# Patient Record
Sex: Male | Born: 1965
Health system: Southern US, Community
[De-identification: ages and names within clinical notes are randomized; demographics above are authoritative.]

## PROBLEM LIST (undated history)

## (undated) DIAGNOSIS — H9190 Unspecified hearing loss, unspecified ear: Secondary | ICD-10-CM

## (undated) DIAGNOSIS — F329 Major depressive disorder, single episode, unspecified: Secondary | ICD-10-CM

## (undated) DIAGNOSIS — F419 Anxiety disorder, unspecified: Secondary | ICD-10-CM

## (undated) DIAGNOSIS — T7840XA Allergy, unspecified, initial encounter: Secondary | ICD-10-CM

## (undated) DIAGNOSIS — K219 Gastro-esophageal reflux disease without esophagitis: Secondary | ICD-10-CM

## (undated) DIAGNOSIS — F32A Depression, unspecified: Secondary | ICD-10-CM

## (undated) DIAGNOSIS — I10 Essential (primary) hypertension: Secondary | ICD-10-CM

## (undated) DIAGNOSIS — E785 Hyperlipidemia, unspecified: Secondary | ICD-10-CM

## (undated) DIAGNOSIS — M1990 Unspecified osteoarthritis, unspecified site: Secondary | ICD-10-CM

## (undated) HISTORY — DX: Anxiety disorder, unspecified: F41.9

## (undated) HISTORY — PX: JOINT REPLACEMENT: SHX530

## (undated) HISTORY — PX: REPLACEMENT TOTAL KNEE BILATERAL: SUR1225

## (undated) HISTORY — DX: Gastro-esophageal reflux disease without esophagitis: K21.9

## (undated) HISTORY — DX: Unspecified osteoarthritis, unspecified site: M19.90

## (undated) HISTORY — DX: Hyperlipidemia, unspecified: E78.5

## (undated) HISTORY — DX: Major depressive disorder, single episode, unspecified: F32.9

## (undated) HISTORY — DX: Allergy, unspecified, initial encounter: T78.40XA

## (undated) HISTORY — PX: OTHER SURGICAL HISTORY: SHX169

## (undated) HISTORY — DX: Depression, unspecified: F32.A

## (undated) HISTORY — DX: Unspecified hearing loss, unspecified ear: H91.90

## (undated) HISTORY — DX: Essential (primary) hypertension: I10

---

## 2015-08-02 ENCOUNTER — Encounter: Payer: Self-pay | Admitting: Podiatry

## 2015-08-02 ENCOUNTER — Ambulatory Visit (INDEPENDENT_AMBULATORY_CARE_PROVIDER_SITE_OTHER): Payer: 59 | Admitting: Podiatry

## 2015-08-02 ENCOUNTER — Ambulatory Visit (INDEPENDENT_AMBULATORY_CARE_PROVIDER_SITE_OTHER): Payer: 59

## 2015-08-02 VITALS — BP 137/94 | HR 73 | Resp 18

## 2015-08-02 DIAGNOSIS — M722 Plantar fascial fibromatosis: Secondary | ICD-10-CM

## 2015-08-02 DIAGNOSIS — R52 Pain, unspecified: Secondary | ICD-10-CM | POA: Diagnosis not present

## 2015-08-02 DIAGNOSIS — M7730 Calcaneal spur, unspecified foot: Secondary | ICD-10-CM

## 2015-08-02 MED ORDER — MELOXICAM 15 MG PO TABS: 15.0000 mg | ORAL_TABLET | Freq: Every day | ORAL | Status: DC

## 2015-08-02 MED ORDER — METHYLPREDNISOLONE 4 MG PO TBPK: ORAL_TABLET | ORAL | Status: DC

## 2015-08-02 NOTE — Patient Instructions (Signed)
Start with medrol dose pack. Once this is complete you can start mobic. Do not take together  Plantar Fasciitis With Rehab The plantar fascia is a fibrous, ligament-like, soft-tissue structure that spans the bottom of the foot. Plantar fasciitis, also called heel spur syndrome, is a condition that causes pain in the foot due to inflammation of the tissue. SYMPTOMS   Pain and tenderness on the underneath side of the foot.  Pain that worsens with standing or walking. CAUSES  Plantar fasciitis is caused by irritation and injury to the plantar fascia on the underneath side of the foot. Common mechanisms of injury include:  Direct trauma to bottom of the foot.  Damage to a small nerve that runs under the foot where the main fascia attaches to the heel bone.  Stress placed on the plantar fascia due to bone spurs. RISK INCREASES WITH:   Activities that place stress on the plantar fascia (running, jumping, pivoting, or cutting).  Poor strength and flexibility.  Improperly fitted shoes.  Tight calf muscles.  Flat feet.  Failure to warm-up properly before activity.  Obesity. PREVENTION  Warm up and stretch properly before activity.  Allow for adequate recovery between workouts.  Maintain physical fitness:  Strength, flexibility, and endurance.  Cardiovascular fitness.  Maintain a health body weight.  Avoid stress on the plantar fascia.  Wear properly fitted shoes, including arch supports for individuals who have flat feet. PROGNOSIS  If treated properly, then the symptoms of plantar fasciitis usually resolve without surgery. However, occasionally surgery is necessary. RELATED COMPLICATIONS   Recurrent symptoms that may result in a chronic condition.  Problems of the lower back that are caused by compensating for the injury, such as limping.  Pain or weakness of the foot during push-off following surgery.  Chronic inflammation, scarring, and partial or complete fascia  tear, occurring more often from repeated injections. TREATMENT  Treatment initially involves the use of ice and medication to help reduce pain and inflammation. The use of strengthening and stretching exercises may help reduce pain with activity, especially stretches of the Achilles tendon. These exercises may be performed at home or with a therapist. Your caregiver may recommend that you use heel cups of arch supports to help reduce stress on the plantar fascia. Occasionally, corticosteroid injections are given to reduce inflammation. If symptoms persist for greater than 6 months despite non-surgical (conservative), then surgery may be recommended.  MEDICATION   If pain medication is necessary, then nonsteroidal anti-inflammatory medications, such as aspirin and ibuprofen, or other minor pain relievers, such as acetaminophen, are often recommended.  Do not take pain medication within 7 days before surgery.  Prescription pain relievers may be given if deemed necessary by your caregiver. Use only as directed and only as much as you need.  Corticosteroid injections may be given by your caregiver. These injections should be reserved for the most serious cases, because they may only be given a certain number of times. HEAT AND COLD  Cold treatment (icing) relieves pain and reduces inflammation. Cold treatment should be applied for 10 to 15 minutes every 2 to 3 hours for inflammation and pain and immediately after any activity that aggravates your symptoms. Use ice packs or massage the area with a piece of ice (ice massage).  Heat treatment may be used prior to performing the stretching and strengthening activities prescribed by your caregiver, physical therapist, or athletic trainer. Use a heat pack or soak the injury in warm water. SEEK IMMEDIATE MEDICAL CARE IF:  Treatment seems to offer no benefit, or the condition worsens.  Any medications produce adverse side effects. EXERCISES RANGE OF  MOTION (ROM) AND STRETCHING EXERCISES - Plantar Fasciitis (Heel Spur Syndrome) These exercises may help you when beginning to rehabilitate your injury. Your symptoms may resolve with or without further involvement from your physician, physical therapist or athletic trainer. While completing these exercises, remember:   Restoring tissue flexibility helps normal motion to return to the joints. This allows healthier, less painful movement and activity.  An effective stretch should be held for at least 30 seconds.  A stretch should never be painful. You should only feel a gentle lengthening or release in the stretched tissue. RANGE OF MOTION - Toe Extension, Flexion  Sit with your right / left leg crossed over your opposite knee.  Grasp your toes and gently pull them back toward the top of your foot. You should feel a stretch on the bottom of your toes and/or foot.  Hold this stretch for __________ seconds.  Now, gently pull your toes toward the bottom of your foot. You should feel a stretch on the top of your toes and or foot.  Hold this stretch for __________ seconds. Repeat __________ times. Complete this stretch __________ times per day.  RANGE OF MOTION - Ankle Dorsiflexion, Active Assisted  Remove shoes and sit on a chair that is preferably not on a carpeted surface.  Place right / left foot under knee. Extend your opposite leg for support.  Keeping your heel down, slide your right / left foot back toward the chair until you feel a stretch at your ankle or calf. If you do not feel a stretch, slide your bottom forward to the edge of the chair, while still keeping your heel down.  Hold this stretch for __________ seconds. Repeat __________ times. Complete this stretch __________ times per day.  STRETCH - Gastroc, Standing  Place hands on wall.  Extend right / left leg, keeping the front knee somewhat bent.  Slightly point your toes inward on your back foot.  Keeping your right /  left heel on the floor and your knee straight, shift your weight toward the wall, not allowing your back to arch.  You should feel a gentle stretch in the right / left calf. Hold this position for __________ seconds. Repeat __________ times. Complete this stretch __________ times per day. STRETCH - Soleus, Standing  Place hands on wall.  Extend right / left leg, keeping the other knee somewhat bent.  Slightly point your toes inward on your back foot.  Keep your right / left heel on the floor, bend your back knee, and slightly shift your weight over the back leg so that you feel a gentle stretch deep in your back calf.  Hold this position for __________ seconds. Repeat __________ times. Complete this stretch __________ times per day. STRETCH - Gastrocsoleus, Standing  Note: This exercise can place a lot of stress on your foot and ankle. Please complete this exercise only if specifically instructed by your caregiver.   Place the ball of your right / left foot on a step, keeping your other foot firmly on the same step.  Hold on to the wall or a rail for balance.  Slowly lift your other foot, allowing your body weight to press your heel down over the edge of the step.  You should feel a stretch in your right / left calf.  Hold this position for __________ seconds.  Repeat this exercise with  a slight bend in your right / left knee. Repeat __________ times. Complete this stretch __________ times per day.  STRENGTHENING EXERCISES - Plantar Fasciitis (Heel Spur Syndrome)  These exercises may help you when beginning to rehabilitate your injury. They may resolve your symptoms with or without further involvement from your physician, physical therapist or athletic trainer. While completing these exercises, remember:   Muscles can gain both the endurance and the strength needed for everyday activities through controlled exercises.  Complete these exercises as instructed by your physician,  physical therapist or athletic trainer. Progress the resistance and repetitions only as guided. STRENGTH - Towel Curls  Sit in a chair positioned on a non-carpeted surface.  Place your foot on a towel, keeping your heel on the floor.  Pull the towel toward your heel by only curling your toes. Keep your heel on the floor.  If instructed by your physician, physical therapist or athletic trainer, add ____________________ at the end of the towel. Repeat __________ times. Complete this exercise __________ times per day. STRENGTH - Ankle Inversion  Secure one end of a rubber exercise band/tubing to a fixed object (table, pole). Loop the other end around your foot just before your toes.  Place your fists between your knees. This will focus your strengthening at your ankle.  Slowly, pull your big toe up and in, making sure the band/tubing is positioned to resist the entire motion.  Hold this position for __________ seconds.  Have your muscles resist the band/tubing as it slowly pulls your foot back to the starting position. Repeat __________ times. Complete this exercises __________ times per day.    This information is not intended to replace advice given to you by your health care provider. Make sure you discuss any questions you have with your health care provider.   Document Released: 03/29/2005 Document Revised: 08/13/2014 Document Reviewed: 07/11/2008 Elsevier Interactive Patient Education [employer].

## 2015-08-02 NOTE — Progress Notes (Signed)
   Subjective:    Patient ID: Theodore Munoz, male    DOB: [birth year], 50 y.o.   MRN: [id number]  HPI  50 year old male presents the office with concerns of bilateral foot pain since about 2009. He states he sprained his feet when he does standing. When he walks the pain is relieved. He previoulsy has seen another physician several years ago he had inserts prescribed for which he wears power steps. He also states that a [employer] helps. He does stretch which also helps. Denies any numbness or tingling. No recent injury or trauma. No swelling or redness. The pain does not wake him up at night. No other complaints at this time.  Review of Systems  All other systems reviewed and are negative.      Objective:   Physical Exam General: AAO x3, NAD  Dermatological: Skin is warm, dry and supple bilateral. Nails x 10 are well manicured; remaining integument appears unremarkable at this time. There are no open sores, no preulcerative lesions, no rash or signs of infection present.  Vascular: Dorsalis Pedis artery and Posterior Tibial artery pedal pulses are 2/4 bilateral with immedate capillary fill time. Pedal hair growth present. No varicosities and no lower extremity edema present bilateral. There is no pain with calf compression, swelling, warmth, erythema.   Neruologic: Grossly intact via light touch bilateral. Vibratory intact via tuning fork bilateral. Protective threshold with Semmes Wienstein monofilament intact to all pedal sites bilateral. Patellar and Achilles deep tendon reflexes 2+ bilateral. No Babinski or clonus noted bilateral.   Musculoskeletal:Tenderness to palpation along the plantar medial tubercle of the calcaneus at the insertion of plantar fascia on the left and right foot. There is no pain along the course of the plantar fascia within the arch of the foot. Plantar fascia appears to be intact. There is no pain with lateral compression of the calcaneus or pain with vibratory  sensation. There is no pain along the course or insertion of the achilles tendon. No other areas of tenderness to bilateral lower extremities. Equinus is present. MMT 5/5, ROM WNL.   Gait: Unassisted, Nonantalgic.       Assessment & Plan:  50 year old male with bilateral heel pain, likely result of plantar fasciitis -Treatment options discussed including all alternatives, risks, and complications -Etiology of symptoms were discussed -X-rays were obtained and reviewed with the patient. Heel spur present. No evidence of acute fracture or stress fracture. -Patient elects to proceed with steroid injection into the left and right heel. Under sterile skin preparation, a total of 2.5cc of kenalog 10, 0.5% Marcaine plain, and 2% lidocaine plain were infiltrated into the symptomatic area without complication. A band-aid was applied. Patient tolerated the injection well without complication. Post-injection care with discussed with the patient. Discussed with the patient to ice the area over the next couple of days to help prevent a steroid flare.  -Plantar fascial braces were dispensed. -Discussed shoe gear changes. He will at the practicing new shoes. He is wearing older [employer] shoes today. -Ice daily -Stretching exercises and consistent basis. -He has night splint at home and recommended to use them as he has not been. -Follow-up in 3 weeks or sooner if any problems arise. In the meantime, encouraged to call the office with any questions, concerns, change in symptoms.   Celesta Gentile, DPM

## 2015-08-04 MED FILL — METHYLPREDNISOLONE 4 MG TAB: 4 | 6 days supply | Qty: 21 | Fill #0

## 2015-08-04 MED FILL — MELOXICAM 15 MG TABLET: 15 | 30 days supply | Qty: 30 | Fill #0

## 2015-08-22 ENCOUNTER — Ambulatory Visit (INDEPENDENT_AMBULATORY_CARE_PROVIDER_SITE_OTHER): Payer: 59 | Admitting: Podiatry

## 2015-08-22 ENCOUNTER — Encounter: Payer: Self-pay | Admitting: Podiatry

## 2015-08-22 DIAGNOSIS — M722 Plantar fascial fibromatosis: Secondary | ICD-10-CM

## 2015-08-22 DIAGNOSIS — M7730 Calcaneal spur, unspecified foot: Secondary | ICD-10-CM | POA: Diagnosis not present

## 2015-08-25 DIAGNOSIS — M722 Plantar fascial fibromatosis: Secondary | ICD-10-CM | POA: Insufficient documentation

## 2015-08-25 HISTORY — DX: Plantar fascial fibromatosis: M72.2

## 2015-08-25 NOTE — Progress Notes (Signed)
Patient ID: Theodore Munoz, male   DOB: 10/12/1965, 50 y.o.   MRN: [id number]  Subjective: 50 year old male presents the office they felt evaluation of bilateral heel pain. Since last appointment he states the injection has helped quite a bit and he has purchased new shoes. He has had significant resolution and heel pain since last appointment and I'll he has very mild discomfort at times currently. He is going on expansion any pain. His been anti-inflammatories as needed. Denies any systemic complaints such as fevers, chills, nausea, vomiting. No acute changes since last appointment, and no other complaints at this time.   Objective: AAO x3, NAD DP/PT pulses palpable bilaterally, CRT less than 3 seconds Protective sensation intact with Simms Weinstein monofilament Here is currently no tenderness to palpation along the plantar medial tubercle of the calcaneus at the insertion of plantar fascia on the left or right foot. There is no pain along the course of the plantar fascia within the arch of the foot. Plantar fascia appears to be intact. There is no pain with lateral compression of the calcaneus or pain with vibratory sensation. There is no pain along the course or insertion of the achilles tendon. No other areas of tenderness to bilateral lower extremities. No areas of pinpoint bony tenderness or pain with vibratory sensation. MMT 5/5, ROM WNL. No edema, erythema, increase in warmth to bilateral lower extremities.  No open lesions or pre-ulcerative lesions.  No pain with calf compression, swelling, warmth, erythema  Assessment: Resolving heel pain bilaterally  Plan: -All treatment options discussed with the patient including all alternatives, risks, complications.  -We'll hold off on second steroid injection this time as he has had significant resolution of symptoms. Recommend continue with stretching, icing as well as supportive shoes. Also anti-inflammatories as needed. Follow up with symptoms  not completely resolved within 4 weeks or sooner if he issues are to arise or any reoccurrence. -Patient encouraged to call the office with any questions, concerns, change in symptoms.   Celesta Gentile, DPM

## 2015-09-05 MED FILL — MELOXICAM 15 MG TABLET: 15 | 30 days supply | Qty: 30 | Fill #1

## 2015-09-22 ENCOUNTER — Encounter: Payer: Self-pay | Admitting: Podiatry

## 2015-09-22 ENCOUNTER — Ambulatory Visit (INDEPENDENT_AMBULATORY_CARE_PROVIDER_SITE_OTHER): Payer: 59 | Admitting: Podiatry

## 2015-09-22 DIAGNOSIS — M722 Plantar fascial fibromatosis: Secondary | ICD-10-CM

## 2015-09-22 DIAGNOSIS — M7730 Calcaneal spur, unspecified foot: Secondary | ICD-10-CM | POA: Diagnosis not present

## 2015-09-22 NOTE — Progress Notes (Signed)
Patient ID: Theodore Munoz, male   DOB: 06/13/1965, 50 y.o.   MRN: [id number]  Subjective: 50 year old male presents the office for follow-up evaluation of bilateral heel pain. He states that he continues to do well but he has started to notice some reoccurrence of pain although not at bad as it was before. He does stretch and ice and wear supportive shoes. No recent injury.  Denies any systemic complaints such as fevers, chills, nausea, vomiting. No acute changes since last appointment, and no other complaints at this time.   Objective: AAO x3, NAD DP/PT pulses palpable bilaterally, CRT less than 3 seconds Protective sensation intact with Simms Weinstein monofilament There is slight tenderness palpation of the plantar medial tubercle of the calcaneus at insertion of plantar fascial bilaterally. There is no pain within the arch of the foot. No pain lateral compression. Plantar fascia appears to be intact. No other areas of tenderness bilaterally. MMT 5/5. Equinus is present No open lesions or pre-ulcerative lesions.  No pain with calf compression, swelling, warmth, erythema  Assessment: Bilateral heel pain, plantar fasciitis  Plan: -All treatment options discussed with the patient including all alternatives, risks, complications.  -Discussed steroid injection but he would like to hold off as he is going to [place] and would like to see if the shots before he goes. -Dispensed night splint. -Stretching icing daily. -Continue supportive shoes and power steps. -Follow-up 6 weeks or sooner if needed.  Celesta Gentile, DPM

## 2015-11-03 ENCOUNTER — Ambulatory Visit (INDEPENDENT_AMBULATORY_CARE_PROVIDER_SITE_OTHER): Payer: 59 | Admitting: Podiatry

## 2015-11-03 DIAGNOSIS — M722 Plantar fascial fibromatosis: Secondary | ICD-10-CM

## 2015-11-03 MED ORDER — MELOXICAM 15 MG PO TABS: 15.0000 mg | ORAL_TABLET | Freq: Every day | ORAL | 2 refills | Status: DC

## 2015-11-03 MED FILL — MELOXICAM 15 MG TABLET: 15 | 30 days supply | Qty: 30 | Fill #2

## 2015-11-03 NOTE — Progress Notes (Signed)
Patient ID: Theodore Munoz, male   DOB: Jan 29, 1966, 50 y.o.   MRN: [id number]  Subjective: 50 year old male presents the office for follow-up evaluation of bilateral heel pain. He states that he is doing better. He still gets occasional pain to the heel. Course pain is when going barefoot or after walking for about 10-15 minutes. He has changes shoes and this has been helping quite a bit as well. Denies any numbness or tingling. The pain does not wake him up and IP is continuing stretching, icing.  Objective: AAO x3, NAD DP/PT pulses palpable bilaterally, CRT less than 3 seconds Protective sensation intact with Simms Weinstein monofilament There is mild tenderness palpation of the plantar medial tubercle of the calcaneus at insertion of plantar fascial bilaterally. There is no pain within the arch of the foot. No pain lateral compression. Plantar fascia appears to be intact. No other areas of tenderness bilaterally. MMT 5/5. Equinus is present No open lesions or pre-ulcerative lesions.  No pain with calf compression, swelling, warmth, erythema  Assessment: Bilateral heel pain, plantar fasciitis  Plan: -All treatment options discussed with the patient including all alternatives, risks, complications.  -Patient elects to proceed with steroid injection into the left and right heel. Under sterile skin preparation, a total of 2.5cc of kenalog 10, 0.5% Marcaine plain, and 2% lidocaine plain were infiltrated into the symptomatic area without complication. A band-aid was applied. Patient tolerated the injection well without complication. Post-injection care with discussed with the patient. Discussed with the patient to ice the area over the next couple of days to help prevent a steroid flare.  -Continue stretching, icing exercises daily. Continue supportive shoe gear. -Discussed orthtoics.  -Follow-up in 4 weeks or sooner if any problems arise. In the meantime, encouraged to call the office with any  questions, concerns, change in symptoms.    Celesta Gentile, DPM   -Discussed steroid injection but he would like to hold off as he is going to [place] and would like to see if the shots before he goes. -Dispensed night splint. -Stretching icing daily. -Continue supportive shoes and power steps. -Follow-up 6 weeks or sooner if needed.  Celesta Gentile, DPM

## 2015-12-08 ENCOUNTER — Encounter: Payer: Self-pay | Admitting: Podiatry

## 2015-12-08 ENCOUNTER — Ambulatory Visit (INDEPENDENT_AMBULATORY_CARE_PROVIDER_SITE_OTHER): Payer: 59 | Admitting: Podiatry

## 2015-12-08 DIAGNOSIS — M722 Plantar fascial fibromatosis: Secondary | ICD-10-CM | POA: Diagnosis not present

## 2015-12-08 MED ORDER — TRIAMCINOLONE ACETONIDE 10 MG/ML IJ SUSP: 10.0000 mg | Freq: Once | INTRAMUSCULAR | Status: DC

## 2015-12-08 NOTE — Progress Notes (Signed)
Patient ID: Theodore Munoz, male   DOB: Jun 01, 1965, 50 y.o.   MRN: [id number]  Subjective: 50 year old male presents the office for follow-up evaluation of bilateral heel pain. He states that he is doing better but he is still getting some pain mostly with standing. He does get relief from the steroid injections. He has been stretching icing intermittent leg. He digits returned from [place] doing a lot of walking started to have recurrence of the heel pain. No other complaints today.   Objective: AAO x3, NAD DP/PT pulses palpable bilaterally, CRT less than 3 seconds Protective sensation intact with Simms Weinstein monofilament There is mild continued  tenderness palpation of the plantar medial tubercle of the calcaneus at insertion of plantar fascial bilaterally. There is no pain within the arch of the foot. No pain lateral compression. Plantar fascia appears to be intact. No other areas of tenderness bilaterally. MMT 5/5. Equinus is present No open lesions or pre-ulcerative lesions.  No pain with calf compression, swelling, warmth, erythema  Assessment: Bilateral heel pain, plantar fasciitis  Plan: -All treatment options discussed with the patient including all alternatives, risks, complications.   -Patient elects to proceed with steroid injection into the left and right heel. Under sterile skin preparation, a total of 2.5cc of kenalog 10, 0.5% Marcaine plain, and 2% lidocaine plain were infiltrated into the symptomatic area without complication. A band-aid was applied. Patient tolerated the injection well without complication. Post-injection care with discussed with the patient. Discussed with the patient to ice the area over the next couple of days to help prevent a steroid flare.  -He was scanned for orthotics today and they were sent to [facility]. I modified his over-the-counter inserts for now. -Continue stretching, icing exercises daily. Continue supportive shoe gear. -Follow-up in 4 weeks  or sooner if any problems arise. In the meantime, encouraged to call the office with any questions, concerns, change in symptoms.    Celesta Gentile, DPM

## 2015-12-16 ENCOUNTER — Other Ambulatory Visit: Payer: Self-pay

## 2015-12-16 MED ORDER — MELOXICAM 15 MG PO TABS: 15.0000 mg | ORAL_TABLET | Freq: Every day | ORAL | 2 refills | Status: DC

## 2016-01-06 ENCOUNTER — Ambulatory Visit: Payer: 59 | Admitting: *Deleted

## 2016-01-06 DIAGNOSIS — M722 Plantar fascial fibromatosis: Secondary | ICD-10-CM

## 2016-01-06 NOTE — Progress Notes (Signed)
Patient ID: Theodore Munoz, male   DOB: January 21, 1966, 50 y.o.   MRN: [id number] Patient presents for orthotic pick up.  Verbal and written break in and wear instructions given.  Patient will follow up in 4 weeks if symptoms worsen or fail to improve.

## 2016-01-06 NOTE — Patient Instructions (Signed)

## 2016-01-21 ENCOUNTER — Telehealth: Payer: Self-pay | Admitting: *Deleted

## 2016-01-21 MED ORDER — MELOXICAM 15 MG PO TABS: 15.0000 mg | ORAL_TABLET | Freq: Every day | ORAL | 0 refills | Status: DC

## 2016-01-21 NOTE — Telephone Encounter (Signed)
Pt's wife, Tilda Burrow called states pt needs a refill of the Meloxicam, is having to take a lot of Ibuprofen. Left message informing Deanna I had refilled the Meloxicam for #15 to get pt to an appt he would need to schedule.

## 2016-03-22 MED FILL — MELOXICAM 15 MG TABLET: 15 | 30 days supply | Qty: 30 | Fill #0

## 2016-04-27 MED FILL — MELOXICAM 15 MG TABLET: 15 | 30 days supply | Qty: 30 | Fill #1

## 2016-04-30 DIAGNOSIS — M25561 Pain in right knee: Secondary | ICD-10-CM | POA: Diagnosis not present

## 2016-05-25 MED FILL — MELOXICAM 15 MG TABLET: 15 | 30 days supply | Qty: 30 | Fill #0

## 2016-06-30 MED FILL — MELOXICAM 15 MG TABLET: 15 | 30 days supply | Qty: 30 | Fill #1

## 2016-07-29 ENCOUNTER — Encounter: Payer: Self-pay | Admitting: Podiatry

## 2016-07-29 ENCOUNTER — Ambulatory Visit (INDEPENDENT_AMBULATORY_CARE_PROVIDER_SITE_OTHER): Payer: 59 | Admitting: Podiatry

## 2016-07-29 ENCOUNTER — Ambulatory Visit (INDEPENDENT_AMBULATORY_CARE_PROVIDER_SITE_OTHER): Payer: 59

## 2016-07-29 DIAGNOSIS — M722 Plantar fascial fibromatosis: Secondary | ICD-10-CM | POA: Diagnosis not present

## 2016-07-29 DIAGNOSIS — M7730 Calcaneal spur, unspecified foot: Secondary | ICD-10-CM

## 2016-07-29 MED ORDER — MELOXICAM 15 MG PO TABS: 15.0000 mg | ORAL_TABLET | Freq: Every day | ORAL | 1 refills | Status: DC

## 2016-07-29 MED FILL — MELOXICAM 15 MG TABLET: 15 | 30 days supply | Qty: 30 | Fill #0

## 2016-07-29 NOTE — Progress Notes (Signed)
Subjective: 51 year old male presents the office today for concerns of recurring bilateral heel pain. He states it feels the same as he has previously. He has pain in the morning or throbbing sensation laterally heel. This been worsening over the last 1 month. No recent injury or trauma. The pain does not wake debridement. Requesting injections as well as a refill the meloxicam. He is been stretching intermittently but not icing. Denies any systemic complaints such as fevers, chills, nausea, vomiting. No acute changes since last appointment, and no other complaints at this time.   Objective: AAO x3, NAD DP/PT pulses palpable bilaterally, CRT less than 3 seconds Tenderness to palpation along the plantar medial tubercle of the calcaneus at the insertion of plantar fascia on the left and right foot. There is no pain along the course of the plantar fascia within the arch of the foot. Plantar fascia appears to be intact. There is no pain with lateral compression of the calcaneus or pain with vibratory sensation. There is no pain along the course or insertion of the achilles tendon. No other areas of tenderness to bilateral lower extremities. Negative Tinel sign No open lesions or pre-ulcerative lesions.  No pain with calf compression, swelling, warmth, erythema  Assessment: Bilateral heel pain likely plantar fasciitis  Plan: -All treatment options discussed with the patient including all alternatives, risks, complications.  -X-rays were obtained and reviewed with the patient. Calcaneal spurring present. No evidence of acute fracture. -Patient elects to proceed with steroid injection into the left and right heel. Under sterile skin preparation, a total of 2.5cc of kenalog 10, 0.5% Marcaine plain, and 2% lidocaine plain were infiltrated into the symptomatic area without complication. A band-aid was applied. Patient tolerated the injection well without complication. Post-injection care with discussed with  the patient. Discussed with the patient to ice the area over the next couple of days to help prevent a steroid flare.  -Prescribed mobic. Discussed side effects of the medication and directed to stop if any are to occur and call the office.  -Return as stretching, icing exercises daily. -Continue orthotics as well as supportive shoe gear. -Patient encouraged to call the office with any questions, concerns, change in symptoms.   Theodore Munoz, DPM

## 2016-08-26 ENCOUNTER — Encounter: Payer: Self-pay | Admitting: Podiatry

## 2016-08-26 ENCOUNTER — Ambulatory Visit (INDEPENDENT_AMBULATORY_CARE_PROVIDER_SITE_OTHER): Payer: 59 | Admitting: Podiatry

## 2016-08-26 DIAGNOSIS — M722 Plantar fascial fibromatosis: Secondary | ICD-10-CM | POA: Diagnosis not present

## 2016-08-27 NOTE — Progress Notes (Signed)
Subjective: 50 year old male presents the office today for concerns of recurring bilateral heel pain. He states he was doing well but over the last week he is to develop recurrent heel pain. He states that he gets pain stiffness in the morning or if his been sitting for some time and stands back up. Denies any numbness or tingling pain does not waken at night. No recent injury. He has been on vacation and walking more. Denies any systemic complaints such as fevers, chills, nausea, vomiting. No acute changes since last appointment, and no other complaints at this time.   Objective: AAO x3, NAD DP/PT pulses palpable bilaterally, CRT less than 3 seconds There mis mild continued tenderness to palpation along the plantar medial tubercle of the calcaneus at the insertion of plantar fascia on the left and right foot. There is no pain along the course of the plantar fascia within the arch of the foot. Plantar fascia appears to be intact. There is no pain with lateral compression of the calcaneus or pain with vibratory sensation. There is no pain along the course or insertion of the achilles tendon. No other areas of tenderness to bilateral lower extremities. Negative Tinel sign No open lesions or pre-ulcerative lesions.  No pain with calf compression, swelling, warmth, erythema  Assessment: Bilateral heel pain likely plantar fasciitis  Plan: -All treatment options discussed with the patient including all alternatives, risks, complications.  -He has had multiple steroid injections and they seem to be temporary. Will hold off on any further. Recommend continue with inserts. This point recommend to start with physical therapy. Prescriptions provided today for this. Rx for [facility] provided.  -RTC after PT or sooner if needed.  -Patient encouraged to call the office with any questions, concerns, change in symptoms.   Celesta Gentile, DPM

## 2016-09-13 MED FILL — MELOXICAM 15 MG TABLET: 15 | 30 days supply | Qty: 30 | Fill #1

## 2016-09-23 DIAGNOSIS — M25571 Pain in right ankle and joints of right foot: Secondary | ICD-10-CM | POA: Diagnosis not present

## 2016-09-23 DIAGNOSIS — M25572 Pain in left ankle and joints of left foot: Secondary | ICD-10-CM | POA: Diagnosis not present

## 2016-09-23 DIAGNOSIS — M25674 Stiffness of right foot, not elsewhere classified: Secondary | ICD-10-CM | POA: Diagnosis not present

## 2016-09-28 DIAGNOSIS — M25572 Pain in left ankle and joints of left foot: Secondary | ICD-10-CM | POA: Diagnosis not present

## 2016-09-28 DIAGNOSIS — M25674 Stiffness of right foot, not elsewhere classified: Secondary | ICD-10-CM | POA: Diagnosis not present

## 2016-09-28 DIAGNOSIS — M25571 Pain in right ankle and joints of right foot: Secondary | ICD-10-CM | POA: Diagnosis not present

## 2016-09-30 DIAGNOSIS — M25572 Pain in left ankle and joints of left foot: Secondary | ICD-10-CM | POA: Diagnosis not present

## 2016-09-30 DIAGNOSIS — M25571 Pain in right ankle and joints of right foot: Secondary | ICD-10-CM | POA: Diagnosis not present

## 2016-09-30 DIAGNOSIS — M25674 Stiffness of right foot, not elsewhere classified: Secondary | ICD-10-CM | POA: Diagnosis not present

## 2016-10-05 DIAGNOSIS — M25674 Stiffness of right foot, not elsewhere classified: Secondary | ICD-10-CM | POA: Diagnosis not present

## 2016-10-05 DIAGNOSIS — M25572 Pain in left ankle and joints of left foot: Secondary | ICD-10-CM | POA: Diagnosis not present

## 2016-10-05 DIAGNOSIS — M25571 Pain in right ankle and joints of right foot: Secondary | ICD-10-CM | POA: Diagnosis not present

## 2016-10-07 DIAGNOSIS — M25572 Pain in left ankle and joints of left foot: Secondary | ICD-10-CM | POA: Diagnosis not present

## 2016-10-07 DIAGNOSIS — M25571 Pain in right ankle and joints of right foot: Secondary | ICD-10-CM | POA: Diagnosis not present

## 2016-10-07 DIAGNOSIS — M25674 Stiffness of right foot, not elsewhere classified: Secondary | ICD-10-CM | POA: Diagnosis not present

## 2016-10-12 DIAGNOSIS — M25572 Pain in left ankle and joints of left foot: Secondary | ICD-10-CM | POA: Diagnosis not present

## 2016-10-12 DIAGNOSIS — M25674 Stiffness of right foot, not elsewhere classified: Secondary | ICD-10-CM | POA: Diagnosis not present

## 2016-10-12 DIAGNOSIS — M25571 Pain in right ankle and joints of right foot: Secondary | ICD-10-CM | POA: Diagnosis not present

## 2016-10-14 DIAGNOSIS — M25572 Pain in left ankle and joints of left foot: Secondary | ICD-10-CM | POA: Diagnosis not present

## 2016-10-14 DIAGNOSIS — M25674 Stiffness of right foot, not elsewhere classified: Secondary | ICD-10-CM | POA: Diagnosis not present

## 2016-10-14 DIAGNOSIS — M25571 Pain in right ankle and joints of right foot: Secondary | ICD-10-CM | POA: Diagnosis not present

## 2016-10-19 ENCOUNTER — Telehealth: Payer: Self-pay | Admitting: *Deleted

## 2016-10-19 DIAGNOSIS — M25674 Stiffness of right foot, not elsewhere classified: Secondary | ICD-10-CM | POA: Diagnosis not present

## 2016-10-19 DIAGNOSIS — M25571 Pain in right ankle and joints of right foot: Secondary | ICD-10-CM | POA: Diagnosis not present

## 2016-10-19 DIAGNOSIS — M25572 Pain in left ankle and joints of left foot: Secondary | ICD-10-CM | POA: Diagnosis not present

## 2016-10-19 MED ORDER — MELOXICAM 15 MG PO TABS: 15.0000 mg | ORAL_TABLET | Freq: Every day | ORAL | 0 refills | Status: DC

## 2016-10-19 MED FILL — MELOXICAM 15 MG TABLET: 15 | 30 days supply | Qty: 30 | Fill #0

## 2016-10-19 NOTE — Telephone Encounter (Signed)
Pt presented with [facility] form to be signed by Dr. Jacqualyn Posey and requested refill of the Meloxicam. I signed for Dr. Jacqualyn Posey, and standing order okayed the Meloxicam for #30 one tablet daily.

## 2016-10-21 DIAGNOSIS — M25572 Pain in left ankle and joints of left foot: Secondary | ICD-10-CM | POA: Diagnosis not present

## 2016-10-21 DIAGNOSIS — M25571 Pain in right ankle and joints of right foot: Secondary | ICD-10-CM | POA: Diagnosis not present

## 2016-10-21 DIAGNOSIS — M25674 Stiffness of right foot, not elsewhere classified: Secondary | ICD-10-CM | POA: Diagnosis not present

## 2016-10-26 DIAGNOSIS — M25572 Pain in left ankle and joints of left foot: Secondary | ICD-10-CM | POA: Diagnosis not present

## 2016-10-26 DIAGNOSIS — M25674 Stiffness of right foot, not elsewhere classified: Secondary | ICD-10-CM | POA: Diagnosis not present

## 2016-10-26 DIAGNOSIS — M25571 Pain in right ankle and joints of right foot: Secondary | ICD-10-CM | POA: Diagnosis not present

## 2016-11-09 DIAGNOSIS — M25674 Stiffness of right foot, not elsewhere classified: Secondary | ICD-10-CM | POA: Diagnosis not present

## 2016-11-09 DIAGNOSIS — M25572 Pain in left ankle and joints of left foot: Secondary | ICD-10-CM | POA: Diagnosis not present

## 2016-11-09 DIAGNOSIS — M25571 Pain in right ankle and joints of right foot: Secondary | ICD-10-CM | POA: Diagnosis not present

## 2016-11-16 DIAGNOSIS — M25572 Pain in left ankle and joints of left foot: Secondary | ICD-10-CM | POA: Diagnosis not present

## 2016-11-16 DIAGNOSIS — M25674 Stiffness of right foot, not elsewhere classified: Secondary | ICD-10-CM | POA: Diagnosis not present

## 2016-11-16 DIAGNOSIS — M25571 Pain in right ankle and joints of right foot: Secondary | ICD-10-CM | POA: Diagnosis not present

## 2016-11-18 DIAGNOSIS — M25572 Pain in left ankle and joints of left foot: Secondary | ICD-10-CM | POA: Diagnosis not present

## 2016-11-18 DIAGNOSIS — M25674 Stiffness of right foot, not elsewhere classified: Secondary | ICD-10-CM | POA: Diagnosis not present

## 2016-11-18 DIAGNOSIS — M25571 Pain in right ankle and joints of right foot: Secondary | ICD-10-CM | POA: Diagnosis not present

## 2017-06-30 ENCOUNTER — Ambulatory Visit: Payer: 59 | Admitting: Family Medicine

## 2017-06-30 ENCOUNTER — Encounter: Payer: Self-pay | Admitting: Family Medicine

## 2017-06-30 VITALS — BP 132/84 | HR 63 | Temp 98.1°F | Ht 69.0 in | Wt 241.5 lb

## 2017-06-30 DIAGNOSIS — Z23 Encounter for immunization: Secondary | ICD-10-CM

## 2017-06-30 DIAGNOSIS — Z114 Encounter for screening for human immunodeficiency virus [HIV]: Secondary | ICD-10-CM

## 2017-06-30 DIAGNOSIS — Z0000 Encounter for general adult medical examination without abnormal findings: Secondary | ICD-10-CM | POA: Diagnosis not present

## 2017-06-30 DIAGNOSIS — Z1211 Encounter for screening for malignant neoplasm of colon: Secondary | ICD-10-CM

## 2017-06-30 NOTE — Patient Instructions (Addendum)
Call your pharmacy to see about the availability of the new shingles vaccine (Shingrix).  Aim to do some physical exertion for 150 minutes per week. This is typically divided into 5 days per week, 30 minutes per day. The activity should be enough to get your heart rate up. Anything is better than nothing if you have time constraints.  If you do not hear anything about your referral in the next 1-2 weeks, call our office and ask for an update.  If your Mrs has any questions regarding our visit, have her send [place] a MyChart message.   Let us know if you need anything.

## 2017-06-30 NOTE — Progress Notes (Signed)
Chief Complaint  Patient presents with  . Establish Care    Well Male Theodore Munoz is here for a complete physical.   His last physical was >1 year ago.  Current diet: in general, a "healthy" diet, could be better   Current exercise: none Weight trend: stable Does pt snore? No. Daytime fatigue? No. Seat belt? Yes.    Health maintenance Colonoscopy- No Tetanus- No HIV- No Prostate cancer screening- No   Past Medical History:  Diagnosis Date  . Allergy   . Arthritis   . Depression   . GERD (gastroesophageal reflux disease)   . Hearing loss     Past Surgical History:  Procedure Laterality Date  . JOINT REPLACEMENT     Medications  Current Outpatient Medications on File Prior to Visit  Medication Sig Dispense Refill  . famotidine (PEPCID) 20 MG tablet Take 20 mg by mouth 2 (two) times daily.    . naproxen sodium (ALEVE) 220 MG tablet Take 220 mg by mouth 2 (two) times daily as needed.     Allergies No Known Allergies Family History Family History  Problem Relation Age of Onset  . Alcohol abuse Mother   . Arthritis Mother   . Thyroid cancer Mother   . Depression Mother   . Anxiety disorder Mother     Review of Systems: Constitutional:  no fevers or chills Eye:  no recent significant change in vision Ear/Nose/Mouth/Throat:  Ears:  no hearing loss Nose/Mouth/Throat:  no complaints of nasal congestion or bleeding, no sore throat Cardiovascular:  no chest pain, no palpitations Respiratory:  no cough and no shortness of breath Gastrointestinal:  no abdominal pain, no change in bowel habits, no nausea, vomiting, diarrhea, or constipation and no black or bloody stool GU:  Male: negative for dysuria, frequency, and incontinence and negative for prostate symptoms Musculoskeletal/Extremities: +various joint pains in AM; no current pain, redness, or swelling of the joints Integumentary (Skin/Breast):  no abnormal skin lesions reported Neurologic:  no  headaches Endocrine: No unexpected weight changes Hematologic/Lymphatic:  no abnormal bleeding  Exam BP 132/84 (BP Location: Left Arm, Patient Position: Sitting, Cuff Size: Large)   Pulse 63   Temp 98.1 F (36.7 C) (Oral)   Ht 5\' 9"  (1.753 m)   Wt 241 lb 8 oz (109.5 kg)   SpO2 94%   BMI 35.66 kg/m  General:  well developed, well nourished, in no apparent distress Skin:  no significant moles, warts, or growths Head:  no masses, lesions, or tenderness Eyes:  pupils equal and round, sclera anicteric without injection Ears:  canals without lesions, TMs shiny without retraction, no obvious effusion, no erythema Nose:  nares patent, septum midline, mucosa normal Throat/Pharynx:  lips and gingiva without lesion; tongue and uvula midline; non-inflamed pharynx; no exudates or postnasal drainage Neck: neck supple without adenopathy, thyromegaly, or masses Lungs:  clear to auscultation, breath sounds equal bilaterally, no respiratory distress Cardio:  regular rate and rhythm without murmurs, heart sounds without clicks or rubs Abdomen:  abdomen soft, nontender; bowel sounds normal; no masses or organomegaly Genital (male): circumcised penis, no lesions or discharge; testes present bilaterally without masses or tenderness Rectal: Deferred Musculoskeletal:  symmetrical muscle groups noted without atrophy or deformity Extremities:  no clubbing, cyanosis, or edema, no deformities, no skin discoloration Neuro:  gait normal; deep tendon reflexes normal and symmetric Psych: well oriented with normal range of affect and appropriate judgment/insight  Assessment and Plan  Well adult exam - Plan: Comprehensive metabolic panel,  Lipid panel  Screening for HIV (human immunodeficiency virus) - Plan: HIV antibody  Screen for colon cancer - Plan: Ambulatory referral to Gastroenterology  Need for tetanus booster - Plan: Tdap vaccine greater than or equal to 73yo IM   Well 52 y.o. male. Counseled on diet  and exercise. Counseled on risks and benefits of prostate cancer screening with PSA. The patient agrees to forego testing. Immunizations, labs, and further orders as above. Follow up in 1 yr or prn. The patient voiced understanding and agreement to the plan.  [place], DO 06/30/17 12:23 PM

## 2017-06-30 NOTE — Progress Notes (Signed)
Pre visit review using our clinic review tool, if applicable. No additional management support is needed unless otherwise documented below in the visit note. 

## 2017-07-01 LAB — COMPREHENSIVE METABOLIC PANEL
ALBUMIN: 4.4 g/dL (ref 3.5–5.2)
ALK PHOS: 60 U/L (ref 39–117)
ALT: 34 U/L (ref 0–53)
AST: 22 U/L (ref 0–37)
BUN: 17 mg/dL (ref 6–23)
CHLORIDE: 107 meq/L (ref 96–112)
CO2: 26 meq/L (ref 19–32)
CREATININE: 1.01 mg/dL (ref 0.40–1.50)
Calcium: 9.2 mg/dL (ref 8.4–10.5)
GFR: 82.86 mL/min (ref >60.00)
GLUCOSE: 102 mg/dL — AB (ref 70–99)
POTASSIUM: 4 meq/L (ref 3.5–5.1)
SODIUM: 142 meq/L (ref 135–145)
Total Bilirubin: 0.7 mg/dL (ref 0.2–1.2)
Total Protein: 6.8 g/dL (ref 6.0–8.3)

## 2017-07-01 LAB — LIPID PANEL
Cholesterol: 162 mg/dL (ref 0–200)
HDL: 43.6 mg/dL (ref >39.00)
LDL Cholesterol: 86 mg/dL (ref 0–99)
NonHDL: 118.71
Total CHOL/HDL Ratio: 4
Triglycerides: 163 mg/dL — ABNORMAL HIGH (ref 0.0–149.0)
VLDL: 32.6 mg/dL (ref 0.0–40.0)

## 2017-07-01 LAB — HIV ANTIBODY (ROUTINE TESTING W REFLEX): HIV 1&2 Ab, 4th Generation: NONREACTIVE

## 2017-07-08 ENCOUNTER — Encounter: Payer: Self-pay | Admitting: Internal Medicine

## 2017-10-03 ENCOUNTER — Encounter: Payer: 59 | Admitting: Internal Medicine

## 2017-10-20 ENCOUNTER — Encounter: Payer: Self-pay | Admitting: Family Medicine

## 2017-12-01 ENCOUNTER — Ambulatory Visit (AMBULATORY_SURGERY_CENTER): Payer: 59 | Admitting: *Deleted

## 2017-12-01 ENCOUNTER — Other Ambulatory Visit: Payer: Self-pay

## 2017-12-01 ENCOUNTER — Encounter: Payer: Self-pay | Admitting: Gastroenterology

## 2017-12-01 ENCOUNTER — Telehealth: Payer: Self-pay | Admitting: Gastroenterology

## 2017-12-01 VITALS — Ht 68.0 in | Wt 244.4 lb

## 2017-12-01 DIAGNOSIS — Z1211 Encounter for screening for malignant neoplasm of colon: Secondary | ICD-10-CM

## 2017-12-01 MED ORDER — SUPREP BOWEL PREP KIT 17.5-3.13-1.6 GM/177ML PO SOLN: 1.0000 | Freq: Once | ORAL | 0 refills | Status: AC

## 2017-12-01 MED FILL — SUPREP BOWEL PREP KIT: 17.5-3.13-1 | 1 days supply | Qty: 354 | Fill #0

## 2017-12-01 NOTE — Progress Notes (Signed)
Patient denies any allergies to egg or soy products. Patient denies complications with anesthesia/sedation.  Patient denies oxygen use at home and denies diet medications. Pamphlet given to patient on Colonoscopy.

## 2017-12-01 NOTE — Telephone Encounter (Signed)
Needs to be 1 whole Suprep kit which is 2 bottles- Pharmacy notified

## 2017-12-01 NOTE — Telephone Encounter (Signed)
[facility] outpatient pharmacy calling to get clarification on prep for patient. Pt had pv today 8.22.19 and prep instructions to the pharmacy state 123mL which is only one bottle. Pharmacy requesting a call back from a nurse.

## 2017-12-15 ENCOUNTER — Ambulatory Visit (AMBULATORY_SURGERY_CENTER): Payer: 59 | Admitting: Gastroenterology

## 2017-12-15 ENCOUNTER — Encounter: Payer: Self-pay | Admitting: Gastroenterology

## 2017-12-15 VITALS — BP 137/79 | HR 61 | Temp 97.8°F | Resp 12 | Ht 68.0 in | Wt 244.0 lb

## 2017-12-15 DIAGNOSIS — Z1211 Encounter for screening for malignant neoplasm of colon: Secondary | ICD-10-CM

## 2017-12-15 DIAGNOSIS — K6389 Other specified diseases of intestine: Secondary | ICD-10-CM | POA: Diagnosis not present

## 2017-12-15 DIAGNOSIS — D125 Benign neoplasm of sigmoid colon: Secondary | ICD-10-CM

## 2017-12-15 DIAGNOSIS — D127 Benign neoplasm of rectosigmoid junction: Secondary | ICD-10-CM | POA: Diagnosis not present

## 2017-12-15 DIAGNOSIS — K635 Polyp of colon: Secondary | ICD-10-CM

## 2017-12-15 MED ORDER — SODIUM CHLORIDE 0.9 % IV SOLN: 500.0000 mL | Freq: Once | INTRAVENOUS | Status: DC

## 2017-12-15 NOTE — Progress Notes (Signed)
Pt's states no medical or surgical changes since previsit or office visit. 

## 2017-12-15 NOTE — Patient Instructions (Signed)
YOU HAD AN ENDOSCOPIC PROCEDURE TODAY AT [facility]:   Refer to the procedure report that was given to you for any specific questions about what was found during the examination.  If the procedure report does not answer your questions, please call your gastroenterologist to clarify.  If you requested that your care partner not be given the details of your procedure findings, then the procedure report has been included in a sealed envelope for you to review at your convenience later.  Thank you for allowing [place] to care for you today!  Resume previous diet and medications.  Await pathology results by mail, 7-10 days.  High Fiber diet is reasonsable due to presence of hemorrhoids and to decrease issues in future.  Repeat colonoscopy 5 years unless guidelines change.  Handouts given for polyps and High Fiber diets.    YOU SHOULD EXPECT: Some feelings of bloating in the abdomen. Passage of more gas than usual.  Walking can help get rid of the air that was put into your GI tract during the procedure and reduce the bloating. If you had a lower endoscopy (such as a colonoscopy or flexible sigmoidoscopy) you may notice spotting of blood in your stool or on the toilet paper. If you underwent a bowel prep for your procedure, you may not have a normal bowel movement for a few days.  Please Note:  You might notice some irritation and congestion in your nose or some drainage.  This is from the oxygen used during your procedure.  There is no need for concern and it should clear up in a day or so.  SYMPTOMS TO REPORT IMMEDIATELY:   Following lower endoscopy (colonoscopy or flexible sigmoidoscopy):  Excessive amounts of blood in the stool  Significant tenderness or worsening of abdominal pains  Swelling of the abdomen that is new, acute  Fever of 100F or higher   For urgent or emergent issues, a gastroenterologist can be reached at any hour by calling [phone].   DIET:  We do  recommend a small meal at first, but then you may proceed to your regular diet.  Drink plenty of fluids but you should avoid alcoholic beverages for 24 hours.  ACTIVITY:  You should plan to take it easy for the rest of today and you should NOT DRIVE or use heavy machinery until tomorrow (because of the sedation medicines used during the test).    FOLLOW UP: Our staff will call the number listed on your records the next business day following your procedure to check on you and address any questions or concerns that you may have regarding the information given to you following your procedure. If we do not reach you, we will leave a message.  However, if you are feeling well and you are not experiencing any problems, there is no need to return our call.  We will assume that you have returned to your regular daily activities without incident.  If any biopsies were taken you will be contacted by phone or by letter within the next 1-3 weeks.  Please call us at [phone] if you have not heard about the biopsies in 3 weeks.    SIGNATURES/CONFIDENTIALITY: You and/or your care partner have signed paperwork which will be entered into your electronic medical record.  These signatures attest to the fact that that the information above on your After Visit Summary has been reviewed and is understood.  Full responsibility of the confidentiality of this discharge information lies  with you and/or your care-partner.

## 2017-12-15 NOTE — Op Note (Addendum)
[facility] Patient Name: Theodore Munoz Procedure Date: 12/15/2017 7:40 AM MRN: [id number] Endoscopist: Justice Britain , MD Age: 52 Referring MD:  Date of Birth: 1966-02-18 Gender: Male Account #: [id number] Procedure:                Colonoscopy Indications:              Screening for malignant neoplasm in the colon, This                            is the patient's first colonoscopy Medicines:                Monitored Anesthesia Care Procedure:                Pre-Anesthesia Assessment:                           - Prior to the procedure, a History and Physical                            was performed, and patient medications and                            allergies were reviewed. The patient's tolerance of                            previous anesthesia was also reviewed. The risks                            and benefits of the procedure and the sedation                            options and risks were discussed with the patient.                            All questions were answered, and informed consent                            was obtained. Prior Anticoagulants: The patient has                            taken no previous anticoagulant or antiplatelet                            agents. ASA Grade Assessment: II - A patient with                            mild systemic disease. After reviewing the risks                            and benefits, the patient was deemed in                            satisfactory condition to undergo the procedure.  After obtaining informed consent, the colonoscope                            was passed under direct vision. Throughout the                            procedure, the patient's blood pressure, pulse, and                            oxygen saturations were monitored continuously. The                            Colonoscope was introduced through the anus and                            advanced to the the  cecum, identified by                            transillumination. The colonoscopy was performed                            without difficulty. The patient tolerated the                            procedure well. The quality of the bowel                            preparation was evaluated using the BBPS [facility]                            Bowel Preparation Scale) with scores of: Right                            Colon = 3, Transverse Colon = 3 and Left Colon = 3                            (entire mucosa seen well with no residual staining,                            small fragments of stool or opaque liquid). The                            total BBPS score equals 9. Scope In: 7:56:55 AM Scope Out: 8:12:12 AM Scope Withdrawal Time: 0 hours 10 minutes 41 seconds  Total Procedure Duration: 0 hours 15 minutes 17 seconds  Findings:                 The digital rectal exam findings include                            non-thrombosed external hemorrhoids. Pertinent                            negatives include no palpable rectal lesions.  The terminal ileum and ileocecal valve appeared                            normal.                           A 3 mm polyp was found in the sigmoid colon. The                            polyp was sessile. The polyp was removed with a                            jumbo cold forceps. Resection and retrieval were                            complete.                           A 4 mm polyp was found in the recto-sigmoid colon.                            The polyp was semi-sessile. The polyp was removed                            with a cold snare. Resection and retrieval were                            complete.                           Normal mucosa was found in the entire colon                            otherwise.                           Non-bleeding non-thrombosed external and internal                            hemorrhoids were found  during retroflexion, during                            perianal exam and during digital exam. The                            hemorrhoids were Grade I (internal hemorrhoids that                            do not prolapse). Complications:            No immediate complications. Estimated Blood Loss:     Estimated blood loss was minimal. Impression:               - Non-thrombosed external hemorrhoids found on  digital rectal exam.                           - The examined portion of the ileum was normal.                           - One 3 mm polyp in the sigmoid colon, removed with                            a jumbo cold forceps. Resected and retrieved.                           - One 4 mm polyp at the recto-sigmoid colon,                            removed with a cold snare. Resected and retrieved.                           - Normal mucosa in the entire examined colon                            otherwise.                           - Non-bleeding non-thrombosed external and internal                            hemorrhoids. Recommendation:           - The patient will be observed post-procedure,                            until all discharge criteria are met.                           - Discharge patient to home.                           - Patient has a contact number available for                            emergencies. The signs and symptoms of potential                            delayed complications were discussed with the                            patient. Return to normal activities tomorrow.                            Written discharge instructions were provided to the                            patient.                           -  Resume previous diet.                           - Continue present medications.                           - Await pathology results.                           - High Fiber diet is reasonable in setting of                             hemorrhoids to decrease issues in future.                           - Repeat colonoscopy in 5-10 years for surveillance                            based on pathology results.                           - Return to referring physician as previously                            scheduled.                           - The findings and recommendations were discussed                            with the patient's family.                           - The findings and recommendations were discussed                            with the designated responsible adult. Justice Britain, MD 12/15/2017 8:18:22 AM

## 2017-12-15 NOTE — Progress Notes (Signed)
Called to room to assist during endoscopic procedure.  Patient ID and intended procedure confirmed with present staff. Received instructions for my participation in the procedure from the performing physician.  

## 2017-12-15 NOTE — Progress Notes (Signed)
A/ox3 pleased with MAC, report to RN 

## 2017-12-16 ENCOUNTER — Telehealth: Payer: Self-pay

## 2017-12-16 NOTE — Telephone Encounter (Signed)
  Follow up Call-  Call back number 12/15/2017  Post procedure Call Back phone  # [phone]  Permission to leave phone message Yes     Patient questions:  Do you have a fever, pain , or abdominal swelling? No. Pain Score  0 *  Have you tolerated food without any problems? Yes.    Have you been able to return to your normal activities? Yes.    Do you have any questions about your discharge instructions: Diet   No. Medications  No. Follow up visit  No.  Do you have questions or concerns about your Care? No.  Actions: * If pain score is 4 or above: No action needed, pain <4.

## 2017-12-21 ENCOUNTER — Encounter: Payer: Self-pay | Admitting: Gastroenterology

## 2018-01-25 DIAGNOSIS — M25561 Pain in right knee: Secondary | ICD-10-CM | POA: Diagnosis not present

## 2018-02-13 ENCOUNTER — Ambulatory Visit (INDEPENDENT_AMBULATORY_CARE_PROVIDER_SITE_OTHER): Payer: 59

## 2018-02-13 DIAGNOSIS — Z23 Encounter for immunization: Secondary | ICD-10-CM | POA: Diagnosis not present

## 2018-02-13 NOTE — Progress Notes (Signed)
Pt. Here for fluarix injection. Fluarix given in R deltoid IM, pt. Tolerated well.

## 2018-02-24 DIAGNOSIS — M25512 Pain in left shoulder: Secondary | ICD-10-CM | POA: Diagnosis not present

## 2018-07-06 ENCOUNTER — Encounter: Payer: 59 | Admitting: Family Medicine

## 2018-11-03 ENCOUNTER — Other Ambulatory Visit: Payer: Self-pay

## 2018-11-03 DIAGNOSIS — Z20822 Contact with and (suspected) exposure to covid-19: Secondary | ICD-10-CM

## 2018-11-06 LAB — NOVEL CORONAVIRUS, NAA: SARS-CoV-2, NAA: NOT DETECTED

## 2019-01-04 ENCOUNTER — Ambulatory Visit (INDEPENDENT_AMBULATORY_CARE_PROVIDER_SITE_OTHER): Payer: BC Managed Care – PPO

## 2019-01-04 ENCOUNTER — Other Ambulatory Visit: Payer: Self-pay

## 2019-01-04 DIAGNOSIS — Z23 Encounter for immunization: Secondary | ICD-10-CM | POA: Diagnosis not present

## 2019-01-04 NOTE — Progress Notes (Signed)
Needs flu and shingrix shots

## 2019-04-15 ENCOUNTER — Other Ambulatory Visit: Payer: Self-pay

## 2019-04-15 ENCOUNTER — Encounter (HOSPITAL_BASED_OUTPATIENT_CLINIC_OR_DEPARTMENT_OTHER): Payer: Self-pay

## 2019-04-15 ENCOUNTER — Encounter (HOSPITAL_COMMUNITY): Payer: Self-pay

## 2019-04-15 ENCOUNTER — Emergency Department (HOSPITAL_BASED_OUTPATIENT_CLINIC_OR_DEPARTMENT_OTHER)
Admission: EM | Admit: 2019-04-15 | Discharge: 2019-04-16 | Disposition: A | Discharge status: Home / Self Care | Payer: Managed Care, Other (non HMO) | Attending: Emergency Medicine | Admitting: Emergency Medicine

## 2019-04-15 ENCOUNTER — Ambulatory Visit (HOSPITAL_COMMUNITY)
Admission: EM | Admit: 2019-04-15 | Discharge: 2019-04-15 | Disposition: A | Discharge status: Home / Self Care | Payer: Managed Care, Other (non HMO) | Source: Home / Self Care

## 2019-04-15 DIAGNOSIS — R030 Elevated blood-pressure reading, without diagnosis of hypertension: Secondary | ICD-10-CM | POA: Diagnosis not present

## 2019-04-15 DIAGNOSIS — Z96653 Presence of artificial knee joint, bilateral: Secondary | ICD-10-CM | POA: Insufficient documentation

## 2019-04-15 DIAGNOSIS — R079 Chest pain, unspecified: Secondary | ICD-10-CM | POA: Insufficient documentation

## 2019-04-15 DIAGNOSIS — R0789 Other chest pain: Secondary | ICD-10-CM

## 2019-04-15 DIAGNOSIS — Z79899 Other long term (current) drug therapy: Secondary | ICD-10-CM | POA: Insufficient documentation

## 2019-04-15 DIAGNOSIS — I10 Essential (primary) hypertension: Secondary | ICD-10-CM | POA: Insufficient documentation

## 2019-04-15 DIAGNOSIS — R42 Dizziness and giddiness: Secondary | ICD-10-CM | POA: Diagnosis not present

## 2019-04-15 DIAGNOSIS — F17290 Nicotine dependence, other tobacco product, uncomplicated: Secondary | ICD-10-CM | POA: Insufficient documentation

## 2019-04-15 LAB — BASIC METABOLIC PANEL
Anion gap: 8 (ref 5–15)
BUN: 19 mg/dL (ref 6–20)
CO2: 29 mmol/L (ref 22–32)
Calcium: 9.4 mg/dL (ref 8.9–10.3)
Chloride: 102 mmol/L (ref 98–111)
Creatinine, Ser: 0.97 mg/dL (ref 0.61–1.24)
GFR calc Af Amer: >60 mL/min (ref >60)
GFR calc non Af Amer: >60 mL/min (ref >60)
Glucose, Bld: 95 mg/dL (ref 70–99)
Potassium: 4.9 mmol/L (ref 3.5–5.1)
Sodium: 139 mmol/L (ref 135–145)

## 2019-04-15 LAB — CBC
HCT: 48.7 % (ref 39.0–52.0)
Hemoglobin: 16.4 g/dL (ref 13.0–17.0)
MCH: 31.1 pg (ref 26.0–34.0)
MCHC: 33.7 g/dL (ref 30.0–36.0)
MCV: 92.4 fL (ref 80.0–100.0)
Platelets: 230 10*3/uL (ref 150–400)
RBC: 5.27 MIL/uL (ref 4.22–5.81)
RDW: 12.2 % (ref 11.5–15.5)
WBC: 9.1 10*3/uL (ref 4.0–10.5)
nRBC: 0 % (ref 0.0–0.2)

## 2019-04-15 LAB — TROPONIN I (HIGH SENSITIVITY)
Troponin I (High Sensitivity): 14 ng/L (ref <18)
Troponin I (High Sensitivity): 14 ng/L (ref <18)

## 2019-04-15 NOTE — Discharge Instructions (Addendum)
Go immediately to ER at [facility] further work-up of the symptoms you've had over the last 3 days: chest pain, dizziness and elevated BP for 3 days. You need cardiac enzymes tests ran at minimal to ensure you are not having or have had a heart attack. Your EKG did not reveal any acute concerns, however due to your symptoms additional Emergency department work-up is warranted. If cardiac enzymes are normal, please follow-up with and schedule an appointment Heart Care in [facility] for further work-up.  If any of your cardiac evaluation is abnormal at [facility], you will be transported by [place] to [facility] as all acute cardiac care is managed at [facility].

## 2019-04-15 NOTE — ED Triage Notes (Signed)
Pt c/o chest pain x 4 days. Pt describes the pain as a pressure along with intermittent stabbing. Pt has had associated cough and lightheadedness, and diaphoresis.

## 2019-04-15 NOTE — ED Triage Notes (Signed)
Pt states he has chest pain x 3 days. Pt states it some times a sharp pain in his chest. Pt states he has been light headed. Pt states his B/P has been elevated the last couple of days.

## 2019-04-15 NOTE — ED Provider Notes (Signed)
[place]    CSN: [id number] Arrival date & time: 04/15/19  1558      History   Chief Complaint Chief Complaint  Patient presents with  . Chest Pain    HPI Theodore Munoz is a 54 y.o. male.   HPI  Theodore Munoz evaluation of chest pain , elevated blood pressure, and dizziness x 3 days. No known family history of cardiovascular disease. Elevated blood pressure has been ranged similar to now [id number] systolic  over the last 3 days. Occasionally the pain is sharp and mostly the pain is nagging type pain, localized left chest wall. Pain has remained constant for 3 days. No active current sharp chest pain, dypsnea, or diaphoresis. Denies any unintentional rapid weight gain. No new weakness. Social activities include current smoker of cigars and drinks alcohol weekly, although not daily. Yesterday, he reports experiencing indigestion type symptoms which lingered throughout the day, now resolved. Dizziness and lightheaded has occurred more today than over the prior 3 days. No prior diagnosis of hypertension, irregular heart rhythm and no prior episodes of chest pain. Risk factors include: patient is sedentary, obese, current smoker, and history of heavy alcohol use. Last CPE 2019 and no visit with PCP since that time. Past Medical History:  Diagnosis Date  . Allergy   . Anxiety    no meds  . Arthritis    knees  . Depression    no meds  . GERD (gastroesophageal reflux disease)   . Hearing loss    no hearing aids    Patient Active Problem List   Diagnosis Date Noted  . Plantar fasciitis 08/25/2015    Past Surgical History:  Procedure Laterality Date  . JOINT REPLACEMENT Bilateral    knees     Home Medications    Prior to Admission medications   Medication Sig Start Date End Date Taking? Authorizing Provider  famotidine (PEPCID) 20 MG tablet Take 20 mg by mouth 2 (two) times daily.    [provider]  naproxen sodium (ALEVE) 220 MG tablet Take 220 mg by  mouth 2 (two) times daily as needed.    [provider]    Family History Family History  Problem Relation Age of Onset  . Alcohol abuse Mother   . Arthritis Mother   . Thyroid cancer Mother   . Depression Mother   . Anxiety disorder Mother   . Colon cancer Neg Hx   . Colon polyps Neg Hx   . Rectal cancer Neg Hx   . Stomach cancer Neg Hx     Social History Social History   Tobacco Use  . Smoking status: Current Some Day Smoker    Types: Cigars  . Smokeless tobacco: Never Used  . Tobacco comment: 1-2 cigars / week  Substance Use Topics  . Alcohol use: Yes    Alcohol/week: 7.0 - 9.0 standard drinks    Types: 2 Cans of beer, 5 - 7 Shots of liquor per week    Comment: beer/whiskey  . Drug use: No     Allergies   Patient has no known allergies.   Review of Systems Review of Systems Pertinent negatives listed in HPI  Physical Exam Triage Vital Signs ED Triage Vitals  Enc Vitals Group     BP 04/15/19 1725 (!) 165/85     Pulse Rate 04/15/19 1725 63     Resp --      Temp 04/15/19 1725 98.1 F (36.7 C)  Temp Source 04/15/19 1725 Oral     SpO2 04/15/19 1725 98 %     Weight 04/15/19 1726 240 lb (108.9 kg)     Height --      Head Circumference --      Peak Flow --      Pain Score 04/15/19 1726 2     Pain Loc --      Pain Edu? --      Excl. in [place]? --    No data found.  Updated Vital Signs BP (!) 165/85 (BP Location: Right Arm)   Pulse 63   Temp 98.1 F (36.7 C) (Oral)   Wt 240 lb (108.9 kg)   SpO2 98%   BMI 36.49 kg/m   Visual Acuity Right Eye Distance:   Left Eye Distance:   Bilateral Distance:    Right Eye Near:   Left Eye Near:    Bilateral Near:     Physical Exam Constitutional:      General: He is not in acute distress.    Appearance: Normal appearance. He is not ill-appearing or diaphoretic.  HENT:     Head: Normocephalic.     Nose: Nose normal.  Cardiovascular:     Rate and Rhythm: Normal rate and regular rhythm.      Pulses: Normal pulses.     Heart sounds: Normal heart sounds.  Pulmonary:     Effort: Pulmonary effort is normal.     Breath sounds: Normal breath sounds. No rhonchi.  Abdominal:     General: Bowel sounds are normal. There is distension.     Palpations: Abdomen is soft.  Musculoskeletal:        General: Normal range of motion.     Cervical back: Normal range of motion.  Skin:    General: Skin is warm and dry.  Neurological:     General: No focal deficit present.     Mental Status: He is alert and oriented to person, place, and time.  Psychiatric:        Mood and Affect: Mood normal.        Behavior: Behavior normal.        Thought Content: Thought content normal.    UC Treatments / Results  Labs (all labs ordered are listed, but only abnormal results are displayed) Labs Reviewed - No data to display  EKG Imaged stored in MEDIA file as ECG did not cross over to Epic. NSR, with LVH and repolarization abnormality noted in aVL lead   Radiology No results found.  Procedures Procedures (including critical care time)  Medications Ordered in UC Medications - No data to display  Initial Impression / Assessment and Plan / UC Course  I have reviewed the triage vital signs and the nursing notes.  Pertinent labs & imaging results that were available during my care of the patient were reviewed by me and considered in my medical decision making (see chart for details).   Atypical chest pain, dizziness, and elevated BP w/o a diagnosis of hypertension. Advised Urgent Care cannot perform cardiac enzyme test to rule out an active or recent Myocardial Infarction. Concern regarding weight time of >6 hours at [facility] ER, advised he can be evaluated at another ER as he is well-appearing, stable vital signs and accompanied by spouse. However, if cardiac enzymes or additional diagnostic findings are consistent with acute cardiac syndrome, he will be transported to [facility] and or [facility] certified to treat patients with acute cardiac syndrome  or advance heart disease. Spoke with wife via phone who is Pharmacist employed by [employer] and advised her of my concern with discharging patient home to follow-up with cardiology outpatient. She agreed with option to transport patient to [facility] for initial ER evaluation . Expressed to both patient and spouse, regardless of cardiac enzymes results, patient warrants outpatient cardiology work-up (stress test , Holter monitor, and echocardiogram) given the acute onset of his chest pain symptoms and persistent elevated blood pressure. Information to establish with Heart Care in [facility] included on discharge paperwork. Patient and spouse agreeable with plan.  Final Clinical Impressions(s) / UC Diagnoses   Final diagnoses:  Atypical chest pain  Dizziness and giddiness  Elevated blood-pressure reading, without diagnosis of hypertension     Discharge Instructions     Go immediately to ER at [facility] further work-up of the symptoms you've had over the last 3 days: chest pain, dizziness and elevated BP for 3 days. You need cardiac enzymes tests ran at minimal to ensure you are not having or have had a heart attack. Your EKG did not reveal any acute concerns, however due to your symptoms additional Emergency department work-up is warranted. If cardiac enzymes are normal, please follow-up with and schedule an appointment Heart Care in [facility] for further work-up.  If any of your cardiac evaluation is abnormal at [facility], you will be transported by [place] to [facility] as all acute cardiac care is managed at [facility].     ED Prescriptions    None     PDMP not reviewed this encounter.   Scot Jun, FNP 04/15/19 [phone]

## 2019-04-16 ENCOUNTER — Emergency Department (HOSPITAL_BASED_OUTPATIENT_CLINIC_OR_DEPARTMENT_OTHER): Payer: Managed Care, Other (non HMO)

## 2019-04-16 LAB — D-DIMER, QUANTITATIVE: D-Dimer, Quant: 0.27 ug/mL-FEU (ref 0.00–0.50)

## 2019-04-16 MED ORDER — LISINOPRIL 10 MG PO TABS
10.0000 mg | ORAL_TABLET | Freq: Once | ORAL | Status: AC
  Administered 2019-04-16: 10 mg via ORAL
  Filled 2019-04-16: qty 1

## 2019-04-16 MED ORDER — LISINOPRIL 10 MG PO TABS: 10.0000 mg | ORAL_TABLET | Freq: Every day | ORAL | 0 refills | Status: DC

## 2019-04-16 MED ORDER — HYDROCHLOROTHIAZIDE 25 MG PO TABS: 25.0000 mg | ORAL_TABLET | Freq: Every day | ORAL | 0 refills | Status: DC

## 2019-04-16 MED ORDER — HYDROCHLOROTHIAZIDE 25 MG PO TABS
25.0000 mg | ORAL_TABLET | Freq: Once | ORAL | Status: AC
  Administered 2019-04-16: 03:00:00 25 mg via ORAL
  Filled 2019-04-16: qty 1

## 2019-04-16 NOTE — Progress Notes (Signed)
Cardiology Office Note:   Date:  04/17/2019  NAME:  Theodore Munoz    MRN: [id number] DOB:  Aug 12, 1965   PCP:  Shelda Pal, DO  Cardiologist:  Evalina Field, MD   Referring MD: Shelda Pal*   Chief Complaint  Patient presents with  . Chest Pain   History of Present Illness:   Theodore Munoz is a 54 y.o. male with a hx of HTN who is being seen today for the evaluation of chest pain at the request of Shelda Pal, DO.  He was evaluated in the emergency room on 1/3 for left-sided chest pain, hypertension and dizziness.  Cardiac enzymes were negative for acute coronary syndrome.  EKG demonstrated normal sinus rhythm, heart rate 69 with early repolarization abnormality.  He reports for the past 5 to 6 days has had constant left-sided chest pressure.  He reports it feels like a pinpoint on his chest.  His EKG is unremarkable without acute ischemic changes.  His blood pressure slightly elevated.  He was recently started on blood pressure medications.  He reports no associated shortness of breath with this and no associated palpitations.  The pain does not worsen with exercise and is not better with rest.  The pain simply continues to occur.  He does smoke cigars 2-3 times per week while playing golf.  He also consumes alcohol at a considerable amount weekly.  Review of recent laboratory data shows LDL 86, HDL 43, triglycerides 186.  He reports snoring at night but no formal diagnosis of sleep apnea.  He is overweight needs to lose weight.  No history of diabetes.  No strong family history of coronary artery disease.  He reports the pain is bothersome and he is worried about his heart.  Associated symptoms also include dizziness.  He reports that at times when standing can get lightheaded and dizzy.  He not had any syncope.  He reports the symptoms are bothersome overall.   Past Medical History: Past Medical History:  Diagnosis Date  . Allergy   . Anxiety    no  meds  . Arthritis    knees  . Depression    no meds  . GERD (gastroesophageal reflux disease)   . Hearing loss    no hearing aids  . Hypertension     Past Surgical History: Past Surgical History:  Procedure Laterality Date  . JOINT REPLACEMENT Bilateral    knees  . REPLACEMENT TOTAL KNEE BILATERAL      Current Medications: Current Meds  Medication Sig  . acetaminophen (TYLENOL 8 HOUR) 650 MG CR tablet Take 650 mg by mouth every 8 (eight) hours as needed for pain.  Marland Kitchen aspirin EC 81 MG tablet Take 81 mg by mouth daily.  . famotidine (PEPCID) 20 MG tablet Take 20 mg by mouth 2 (two) times daily.  . hydrochlorothiazide (HYDRODIURIL) 25 MG tablet Take 1 tablet (25 mg total) by mouth daily.  Marland Kitchen lisinopril (ZESTRIL) 10 MG tablet Take 1 tablet (10 mg total) by mouth daily.  . naproxen sodium (ALEVE) 220 MG tablet Take 220 mg by mouth 2 (two) times daily as needed.   Current Facility-Administered Medications for the 04/17/19 encounter (Office Visit) with Geralynn Rile, MD  Medication  . 0.9 %  sodium chloride infusion     Allergies:    Patient has no known allergies.   Social History: Social History   Socioeconomic History  . Marital status: Married    Spouse name:  Deanna  . Number of children: Not on file  . Years of education: Not on file  . Highest education level: Not on file  Occupational History  . Not on file  Tobacco Use  . Smoking status: Current Some Day Smoker    Types: Cigars  . Smokeless tobacco: Never Used  . Tobacco comment: 1-2 cigars / week  Substance and Sexual Activity  . Alcohol use: Yes    Alcohol/week: 7.0 - 9.0 standard drinks    Types: 2 Cans of beer, 5 - 7 Shots of liquor per week    Comment: beer/whiskey  . Drug use: No  . Sexual activity: Yes  Other Topics Concern  . Not on file  Social History Narrative  . Not on file   Social Determinants of Health   Financial Resource Strain:   . Difficulty of Paying Living Expenses: Not on  file  Food Insecurity:   . Worried About Charity fundraiser in the Last Year: Not on file  . Ran Out of Food in the Last Year: Not on file  Transportation Needs:   . Lack of Transportation (Medical): Not on file  . Lack of Transportation (Non-Medical): Not on file  Physical Activity:   . Days of Exercise per Week: Not on file  . Minutes of Exercise per Session: Not on file  Stress:   . Feeling of Stress : Not on file  Social Connections:   . Frequency of Communication with Friends and Family: Not on file  . Frequency of Social Gatherings with Friends and Family: Not on file  . Attends Religious Services: Not on file  . Active Member of Clubs or Organizations: Not on file  . Attends Archivist Meetings: Not on file  . Marital Status: Not on file     Family History: The patient's family history includes Alcohol abuse in his mother; Anxiety disorder in his mother; Arthritis in his mother; Depression in his mother; Thyroid cancer in his mother. There is no history of Colon cancer, Colon polyps, Rectal cancer, or Stomach cancer.  ROS:   All other ROS reviewed and negative. Pertinent positives noted in the HPI.     EKGs/Labs/Other Studies Reviewed:   The following studies were personally reviewed by me today:  EKG:  EKG is ordered today.  The ekg ordered today demonstrates normal sinus rhythm, heart rate 65, early repolarization abnormality, likely LVH, and was personally reviewed by me.   Recent Labs: 04/15/2019: BUN 19; Creatinine, Ser 0.97; Hemoglobin 16.4; Platelets 230; Potassium 4.9; Sodium 139   Recent Lipid Panel    Component Value Date/Time   CHOL 162 06/30/2017 1117   TRIG 163.0 (H) 06/30/2017 1117   HDL 43.60 06/30/2017 1117   CHOLHDL 4 06/30/2017 1117   VLDL 32.6 06/30/2017 1117   LDLCALC 86 06/30/2017 1117    Physical Exam:   VS:  BP (!) 148/84   Pulse 65   Ht 5\' 9"  (1.753 m)   Wt 244 lb (110.7 kg)   SpO2 96%   BMI 36.03 kg/m    Wt Readings from  Last 3 Encounters:  04/17/19 244 lb (110.7 kg)  04/15/19 246 lb 14.4 oz (112 kg)  04/15/19 240 lb (108.9 kg)    General: Well nourished, well developed, in no acute distress Heart: Atraumatic, normal size  Eyes: PEERLA, EOMI  Neck: Supple, no JVD Endocrine: No thryomegaly Cardiac: Normal S1, S2; RRR; no murmurs, rubs, or gallops Lungs: Clear to auscultation bilaterally, no  wheezing, rhonchi or rales  Abd: Soft, nontender, no hepatomegaly  Ext: No edema, pulses 2+ Musculoskeletal: No deformities, BUE and BLE strength normal and equal Skin: Warm and dry, no rashes   Neuro: Alert and oriented to person, place, time, and situation, CNII-XII grossly intact, no focal deficits  Psych: Normal mood and affect   ASSESSMENT:   Theodore Munoz is a 54 y.o. male who presents for the following: 1. Other chest pain   2. Essential hypertension     PLAN:   1. Other chest pain -He presents with atypical chest pain.  I suspect this may be GERD related.  EKG changes are without any ischemic changes and troponins were negative in the emergency room yesterday.  CVD risk factors include hypertension as well as tobacco abuse.  This also could be blood pressure related.  He has been recently diagnosed with hypertension he was noted to have elevated blood pressure in the emergency room. -Given his CVD risk factors of hypertension and tobacco abuse, we will proceed with an echocardiogram as well as a cardiac CTA.  He will take 100 mg of metoprolol tartrate 2 hours before scan.  I discussed his case with our nurse scheduler and we can get him in next Tuesday at 7:30 AM.  We will check a BMP today to make sure this is taken care of. -Regarding activity, he will resume light activity for now.  We will not clear him for heavy exertion until his cardiac CTA.  2. Essential hypertension -Just recently started on lisinopril and HCTZ.  We will continue this for now. -We will also screen for diabetes with A1c  today  Disposition: Return in about 1 month (around 05/18/2019).  Medication Adjustments/Labs and Tests Ordered: Current medicines are reviewed at length with the patient today.  Concerns regarding medicines are outlined above.  No orders of the defined types were placed in this encounter.  No orders of the defined types were placed in this encounter.   Patient Instructions  Medication Instructions:  Your physician has recommended you make the following change in your medication:   STOP TAKING YOUR FAMOTIDINE START  NEXIUM 20 MG BY MOUTH DAILY  *If you need a refill on your cardiac medications before your next appointment, please call your pharmacy*  Lab Work: Your physician recommends that you return for lab work TODAY: Center A1C  If you have labs (blood work) drawn today and your tests are completely normal, you will receive your results only by: Marland Kitchen MyChart Message (if you have MyChart) OR . A paper copy in the mail If you have any lab test that is abnormal or we need to change your treatment, we will call you to review the results.  Testing/Procedures: Your physician has requested that you have an echocardiogram. Echocardiography is a painless test that uses sound waves to create images of your heart. It provides your doctor with information about the size and shape of your heart and how well your heart's chambers and valves are working. This procedure takes approximately one hour. There are no restrictions for this procedure. LOCATION: [facility]: [place], [place], [place] [address]  Your physician has requested that you have cardiac CT. Cardiac computed tomography (CT) is a painless test that uses an x-ray machine to take clear, detailed pictures of your heart. For further information please visit [facility]uge[facility]tn. Please follow instruction sheet as given.  04/24/2019 7:30am  Follow-Up: At  [facility], you and your health needs are our priority.  As part of our continuing mission to provide you with exceptional heart care, we have created designated Provider Care Teams.  These Care Teams include your primary Cardiologist (physician) and Advanced Practice Providers (APPs -  Physician Assistants and Nurse Practitioners) who all work together to provide you with the care you need, when you need it.  Your next appointment:   1 month(s)  The format for your next appointment:   Virtual Visit   Provider:   Eleonore Chiquito, MD  Other Instructions  Your cardiac CT will be scheduled at one of the below locations:   [facility] [address] [place], [place] [address] [phone]  [facility] [address] [place], [place] [address] [phone]  If scheduled at [facility], please arrive at the [facility] main entrance of [facility] 30-45 minutes prior to test start time. Proceed to the [facility] Radiology Department (first floor) to check-in and test prep.  If scheduled at [facility], please arrive 15 mins early for check-in and test prep.  Please follow these instructions carefully (unless otherwise directed):  Hold all erectile dysfunction medications at least 3 days (72 hrs) prior to test.  On the Night Before the Test: . Be sure to Drink plenty of water. . Do not consume any caffeinated/decaffeinated beverages or chocolate 12 hours prior to your test. . Do not take any antihistamines 12 hours prior to your test. . If the patient has contrast allergy: ? Patient will need a prescription for Prednisone and very clear instructions (as follows): 1. Prednisone 50 mg - take 13 hours prior to test 2. Take another Prednisone 50 mg 7 hours prior to test 3. Take another Prednisone 50 mg 1 hour prior to test 4. Take Benadryl 50 mg 1 hour prior to test . Patient  must complete all four doses of above prophylactic medications. . Patient will need a ride after test due to Benadryl.  On the Day of the Test: . Drink plenty of water. Do not drink any water within one hour of the test. . Do not eat any food 4 hours prior to the test. . You may take your regular medications prior to the test.  . Take metoprolol (Lopressor) 100 mg two hours prior to test. . HOLD Furosemide/Hydrochlorothiazide morning of the test.   *For Clinical Staff only. Please instruct patient the following:*        -Drink plenty of water       -Hold Furosemide/hydrochlorothiazide morning of the test       -Take metoprolol (Lopressor) 2 hours prior to test (if applicable).                  -If HR is less than 55 BPM- No Beta Blocker                -IF HR is greater than 55 BPM and patient is less than or equal to 52 yrs old Lopressor 100mg  x1.                -If HR is greater than 55 BPM and patient is greater than 26 yrs old Lopressor 50 mg x1.     Do not give Lopressor to patients with an allergy to lopressor or anyone with asthma or active COPD symptoms (currently taking steroids).  After the Test: . Drink plenty of water. . After receiving IV contrast, you may experience a mild flushed feeling. This is normal. . On occasion, you may experience a mild rash up to 24 hours after the test. This is not dangerous. If this occurs, you can take Benadryl 25 mg and increase your fluid intake. . If you experience trouble breathing, this can be serious. If it is severe call 911 IMMEDIATELY. If it is mild, please call our office. . If you take any of these medications: Glipizide/Metformin, Avandament, Glucavance, please do not take 48 hours after completing test unless otherwise instructed.   Once we have confirmed authorization from your insurance company, we will call you to set up a date and time for your test.   For non-scheduling related questions, please contact the cardiac  imaging nurse navigator should you have any questions/concerns: Marchia Bond, RN Navigator Cardiac Imaging [facility] [phone] Office        Signed, Addison Naegeli. Audie Box, [place]  [address] [place], [place] [address] [phone]  04/17/2019 12:26 PM

## 2019-04-16 NOTE — Discharge Instructions (Addendum)
You were seen today for chest pain and high blood pressure.  Your work-up is reassuring.  You did have elevated blood pressures while in the ED.  Given this and your recent history of documented blood pressures at home, you will be started on blood pressure medication.  Given your risk factors, you need to follow-up closely with cardiology for evaluation for possible stress testing and or Holter monitor.  If you have any new or worsening symptoms including acutely worsening chest pain, shortness of breath, any new or worsening symptoms you should be reevaluated.

## 2019-04-16 NOTE — ED Provider Notes (Signed)
[facility] EMERGENCY DEPARTMENT Provider Note   CSN: [id number] Arrival date & time: 04/15/19  1911     History Chief Complaint  Patient presents with  . Chest Pain    Theodore Munoz is a 54 y.o. male.  HPI     This a 54 year old male with a history of reflux, anxiety who presents with chest pain.  Patient reports he has had 3 to 4-day history of left-sided chest pain.  He describes it as a pressure pain.  It has been constant.  He says at times he gets more intense and sharp.  It does not radiate.  He denies any active pain.  Denies any exacerbating or alleviating factors.  He denies any shortness of breath, cough, fevers.  States that he recently gave blood and noted that he had a high blood pressure.  He has been monitoring his blood pressures at home and they have been elevated.  Patient has also noted some intermittent dizziness.  Denies room spinning dizziness.  Patient denies history of diabetes, hyperlipidemia.  He is a current smoker of cigars and drinks weekly.  No known history of arrhythmia.  Denies any early family history of heart disease.  Denies history of blood clots, leg swelling or tenderness.   Patient was seen earlier yesterday urgent care and referred here for evaluation.  Past Medical History:  Diagnosis Date  . Allergy   . Anxiety    no meds  . Arthritis    knees  . Depression    no meds  . GERD (gastroesophageal reflux disease)   . Hearing loss    no hearing aids    Patient Active Problem List   Diagnosis Date Noted  . Plantar fasciitis 08/25/2015    Past Surgical History:  Procedure Laterality Date  . JOINT REPLACEMENT Bilateral    knees  . REPLACEMENT TOTAL KNEE BILATERAL         Family History  Problem Relation Age of Onset  . Alcohol abuse Mother   . Arthritis Mother   . Thyroid cancer Mother   . Depression Mother   . Anxiety disorder Mother   . Colon cancer Neg Hx   . Colon polyps Neg Hx   . Rectal cancer Neg Hx     . Stomach cancer Neg Hx     Social History   Tobacco Use  . Smoking status: Current Some Day Smoker    Types: Cigars  . Smokeless tobacco: Never Used  . Tobacco comment: 1-2 cigars / week  Substance Use Topics  . Alcohol use: Yes    Alcohol/week: 7.0 - 9.0 standard drinks    Types: 2 Cans of beer, 5 - 7 Shots of liquor per week    Comment: beer/whiskey  . Drug use: No    Home Medications Prior to Admission medications   Medication Sig Start Date End Date Taking? Authorizing Provider  famotidine (PEPCID) 20 MG tablet Take 20 mg by mouth 2 (two) times daily.    [provider]  hydrochlorothiazide (HYDRODIURIL) 25 MG tablet Take 1 tablet (25 mg total) by mouth daily. 04/16/19   Rochester Serpe, Barbette Hair, MD  lisinopril (ZESTRIL) 10 MG tablet Take 1 tablet (10 mg total) by mouth daily. 04/16/19   Francene Mcerlean, Barbette Hair, MD  naproxen sodium (ALEVE) 220 MG tablet Take 220 mg by mouth 2 (two) times daily as needed.    [provider]    Allergies    Patient has no known allergies.  Review of Systems   Review of Systems  Constitutional: Negative for fever.  Respiratory: Negative for cough and shortness of breath.   Cardiovascular: Positive for chest pain. Negative for palpitations and leg swelling.  Gastrointestinal: Negative for abdominal pain, constipation, nausea and vomiting.  Genitourinary: Negative for dysuria.  Neurological: Positive for dizziness.  All other systems reviewed and are negative.   Physical Exam Updated Vital Signs BP (!) 176/96 (BP Location: Left Arm)   Pulse 62   Temp 98.9 F (37.2 C) (Oral)   Resp 19   Ht 1.753 m (5\' 9" )   Wt 112 kg   SpO2 98%   BMI 36.46 kg/m   Physical Exam Vitals and nursing note reviewed.  Constitutional:      Appearance: He is well-developed. He is obese. He is not ill-appearing.  HENT:     Head: Normocephalic and atraumatic.  Eyes:     Pupils: Pupils are equal, round, and reactive to light.  Cardiovascular:      Rate and Rhythm: Normal rate and regular rhythm.     Heart sounds: Normal heart sounds. No murmur.  Pulmonary:     Effort: Pulmonary effort is normal. No respiratory distress.     Breath sounds: Normal breath sounds. No wheezing.  Chest:     Chest wall: No tenderness.  Abdominal:     General: Bowel sounds are normal.     Palpations: Abdomen is soft.     Tenderness: There is no abdominal tenderness. There is no rebound.  Musculoskeletal:     Cervical back: Neck supple.     Right lower leg: No tenderness. No edema.     Left lower leg: No tenderness. No edema.  Lymphadenopathy:     Cervical: No cervical adenopathy.  Skin:    General: Skin is warm and dry.     Findings: Erythema:    Neurological:     Mental Status: He is alert and oriented to person, place, and time.  Psychiatric:        Mood and Affect: Mood normal.     ED Results / Procedures / Treatments   Labs (all labs ordered are listed, but only abnormal results are displayed) Labs Reviewed  BASIC METABOLIC PANEL  CBC  D-DIMER, QUANTITATIVE (NOT AT [facility])  TROPONIN I (HIGH SENSITIVITY)  TROPONIN I (HIGH SENSITIVITY)    EKG EKG Interpretation  Date/Time:  Monday April 16 2019 01:16:07 EST Ventricular Rate:  59 PR Interval:    QRS Duration: 86 QT Interval:  446 QTC Calculation: 442 R Axis:   25 Text Interpretation: Sinus rhythm Probable anteroseptal infarct, old No change from prior yesterday Confirmed by Thayer Jew [id number]) on 04/16/2019 1:26:09 AM   Radiology DG Chest Portable 1 View  Result Date: 04/16/2019 CLINICAL DATA:  Chest pain EXAM: PORTABLE CHEST 1 VIEW COMPARISON:  None. FINDINGS: No focal airspace disease or effusion. Borderline to mild cardiomegaly. No pneumothorax. IMPRESSION: No active disease.  Borderline to mild cardiomegaly Electronically Signed   By: Donavan Foil M.D.   On: 04/16/2019 01:29    Procedures Procedures (including critical care time)  Medications Ordered in  ED Medications  lisinopril (ZESTRIL) tablet 10 mg (has no administration in time range)  hydrochlorothiazide (HYDRODIURIL) tablet 25 mg (has no administration in time range)    ED Course  I have reviewed the triage vital signs and the nursing notes.  Pertinent labs & imaging results that were available during my care of the patient were reviewed by me and  considered in my medical decision making (see chart for details).    MDM Rules/Calculators/A&P                      Patient presents with chest pain and dizziness.  Ongoing constant over the last 3 days.  Not exertional in nature.  He has noted some associated high blood pressure.  He is not currently been treated for high blood pressure.  Initial evaluation notable for blood pressure of 176/96.  Otherwise his vital signs are reassuring.  No reproducible pain on exam.  EKG is largely reassuring.  He does have some Q waves in V1 and V2.  No prior for comparison and this is similar to the EKG obtained at urgent care yesterday.  No acute ischemic changes or arrhythmia noted.  Troponin x2 is negative.  D-dimer is negative as well.  Low suspicion for PE.  Chest x-ray without pneumonia or pneumothorax.  Orthostatics are negative.  He has remained stable without evidence of arrhythmia on the monitor.  I discussed the results with the patient.  His heart score is 2 for risk factors and age.  Feel he can safely be discharged with close cardiology follow-up.  I did discuss with him initiating medications given his blood pressure.  At this time do not see any evidence of hypertensive urgency or emergency.  Will start on HCTZ and lisinopril.  Patient is agreeable to plan.  He will call the cardiology office first thing in the morning.  Likely need stress testing and/or Holter monitoring.     Final Clinical Impression(s) / ED Diagnoses Final diagnoses:  Essential hypertension  Atypical chest pain  Dizziness    Rx / DC Orders ED Discharge Orders          Ordered    hydrochlorothiazide (HYDRODIURIL) 25 MG tablet  Daily     04/16/19 0217    lisinopril (ZESTRIL) 10 MG tablet  Daily     04/16/19 0217           Merryl Hacker, MD 04/16/19 [phone]

## 2019-04-17 ENCOUNTER — Other Ambulatory Visit: Payer: Self-pay

## 2019-04-17 ENCOUNTER — Encounter: Payer: Self-pay | Admitting: Cardiovascular Disease

## 2019-04-17 ENCOUNTER — Ambulatory Visit: Payer: Managed Care, Other (non HMO) | Admitting: Cardiovascular Disease

## 2019-04-17 VITALS — BP 148/84 | HR 65 | Ht 69.0 in | Wt 244.0 lb

## 2019-04-17 DIAGNOSIS — Z131 Encounter for screening for diabetes mellitus: Secondary | ICD-10-CM

## 2019-04-17 DIAGNOSIS — R079 Chest pain, unspecified: Secondary | ICD-10-CM

## 2019-04-17 DIAGNOSIS — R0789 Other chest pain: Secondary | ICD-10-CM | POA: Diagnosis not present

## 2019-04-17 DIAGNOSIS — I10 Essential (primary) hypertension: Secondary | ICD-10-CM | POA: Diagnosis not present

## 2019-04-17 MED ORDER — METOPROLOL TARTRATE 100 MG PO TABS: 100.0000 mg | ORAL_TABLET | Freq: Once | ORAL | 0 refills | Status: DC

## 2019-04-17 MED ORDER — ESOMEPRAZOLE MAGNESIUM 20 MG PO CPDR: 20.0000 mg | DELAYED_RELEASE_CAPSULE | Freq: Every day | ORAL | 3 refills | Status: DC

## 2019-04-17 NOTE — Patient Instructions (Addendum)
Medication Instructions:  Your physician has recommended you make the following change in your medication:   STOP TAKING YOUR FAMOTIDINE START esomeprazole (NEXIUM) 20 MG BY MOUTH DAILY  *If you need a refill on your cardiac medications before your next appointment, please call your pharmacy*  Lab Work: Your physician recommends that you return for lab work TODAY: [place] A1C  If you have labs (blood work) drawn today and your tests are completely normal, you will receive your results only by: Marland Kitchen MyChart Message (if you have MyChart) OR . A paper copy in the mail If you have any lab test that is abnormal or we need to change your treatment, we will call you to review the results.  Testing/Procedures: Your physician has requested that you have an echocardiogram. Echocardiography is a painless test that uses sound waves to create images of your heart. It provides your doctor with information about the size and shape of your heart and how well your heart's chambers and valves are working. This procedure takes approximately one hour. There are no restrictions for this procedure. LOCATION: [facility]: [place], [place], [place] [address]  Your physician has requested that you have cardiac CT. Cardiac computed tomography (CT) is a painless test that uses an x-ray machine to take clear, detailed pictures of your heart. For further information please visit [email or web address]. Please follow instruction sheet as given.  04/24/2019 7:30am   Follow-Up: At [facility], you and your health needs are our priority.  As part of our continuing mission to provide you with exceptional heart care, we have created designated Provider Care Teams.  These Care Teams include your primary Cardiologist (physician) and Advanced Practice Providers (APPs -  Physician Assistants and Nurse Practitioners) who all work together to provide you  with the care you need, when you need it.  Your next appointment:   1 month(s)  The format for your next appointment:   Virtual Visit   Provider:   Eleonore Chiquito, MD  Other Instructions  Your cardiac CT will be scheduled at one of the below locations:   [facility] [address] [place], [place] [address] [phone]  [facility] [address] [place], [place] [address] [phone]  If scheduled at [facility], please arrive at the [facility] main entrance of [facility] 30-45 minutes prior to test start time. Proceed to the [facility] Radiology Department (first floor) to check-in and test prep.  If scheduled at [facility], please arrive 15 mins early for check-in and test prep.  Please follow these instructions carefully (unless otherwise directed):  Hold all erectile dysfunction medications at least 3 days (72 hrs) prior to test.  On the Night Before the Test: . Be sure to Drink plenty of water. . Do not consume any caffeinated/decaffeinated beverages or chocolate 12 hours prior to your test. . Do not take any antihistamines 12 hours prior to your test.   On the Day of the Test: . Drink plenty of water. Do not drink any water within one hour of the test. . Do not eat any food 4 hours prior to the test. . You may take your regular medications prior to the test.  . Take metoprolol (Lopressor) 100 mg two hours prior to test. . HOLD Hydrochlorothiazide morning of the test.       After the Test: .  Drink plenty of water. . After receiving IV contrast, you may experience a mild flushed feeling. This is normal. . On occasion, you may experience a mild rash up to 24 hours after the test. This is not dangerous. If this occurs, you can take Benadryl 25 mg and increase your fluid intake. . If you experience trouble breathing, this can be serious. If it is severe  call 911 IMMEDIATELY. If it is mild, please call our office.   Once we have confirmed authorization from your insurance company, we will call you to set up a date and time for your test.   For non-scheduling related questions, please contact the cardiac imaging nurse navigator should you have any questions/concerns: Marchia Bond, RN Navigator Cardiac Imaging [facility] [phone] Office

## 2019-04-18 ENCOUNTER — Other Ambulatory Visit: Payer: Self-pay | Admitting: Cardiovascular Disease

## 2019-04-18 LAB — BASIC METABOLIC PANEL
BUN/Creatinine Ratio: 20 (ref 9–20)
BUN: 19 mg/dL (ref 6–24)
CO2: 24 mmol/L (ref 20–29)
Calcium: 10 mg/dL (ref 8.7–10.2)
Chloride: 98 mmol/L (ref 96–106)
Creatinine, Ser: 0.93 mg/dL (ref 0.76–1.27)
GFR calc Af Amer: 109 mL/min/{1.73_m2} (ref >59)
GFR calc non Af Amer: 94 mL/min/{1.73_m2} (ref >59)
Glucose: 99 mg/dL (ref 65–99)
Potassium: 4.2 mmol/L (ref 3.5–5.2)
Sodium: 139 mmol/L (ref 134–144)

## 2019-04-18 LAB — HEMOGLOBIN A1C
Est. average glucose Bld gHb Est-mCnc: 103 mg/dL
Hgb A1c MFr Bld: 5.2 % (ref 4.8–5.6)

## 2019-04-19 ENCOUNTER — Encounter: Payer: Self-pay | Admitting: Family Medicine

## 2019-04-19 ENCOUNTER — Other Ambulatory Visit: Payer: Self-pay | Admitting: Cardiovascular Disease

## 2019-04-20 ENCOUNTER — Other Ambulatory Visit: Payer: Self-pay | Admitting: Cardiovascular Disease

## 2019-04-20 ENCOUNTER — Encounter (HOSPITAL_COMMUNITY): Payer: Self-pay

## 2019-04-20 ENCOUNTER — Telehealth (HOSPITAL_COMMUNITY): Payer: Self-pay | Admitting: Emergency Medicine

## 2019-04-20 ENCOUNTER — Other Ambulatory Visit (INDEPENDENT_AMBULATORY_CARE_PROVIDER_SITE_OTHER): Payer: Managed Care, Other (non HMO)

## 2019-04-20 DIAGNOSIS — M722 Plantar fascial fibromatosis: Secondary | ICD-10-CM | POA: Diagnosis not present

## 2019-04-20 DIAGNOSIS — R0789 Other chest pain: Secondary | ICD-10-CM | POA: Diagnosis not present

## 2019-04-20 DIAGNOSIS — I10 Essential (primary) hypertension: Secondary | ICD-10-CM | POA: Diagnosis not present

## 2019-04-20 NOTE — Telephone Encounter (Signed)
Left message on voicemail with name and callback number Aalani Aikens RN Navigator Cardiac Imaging [facility] [phone] Office [phone] Cell  

## 2019-04-24 ENCOUNTER — Ambulatory Visit (HOSPITAL_COMMUNITY)
Admission: RE | Admit: 2019-04-24 | Discharge: 2019-04-24 | Disposition: A | Discharge status: Home / Self Care | Payer: Managed Care, Other (non HMO) | Source: Ambulatory Visit | Attending: Cardiovascular Disease | Admitting: Cardiovascular Disease

## 2019-04-24 ENCOUNTER — Other Ambulatory Visit: Payer: Self-pay

## 2019-04-24 ENCOUNTER — Telehealth: Payer: Self-pay | Admitting: Cardiovascular Disease

## 2019-04-24 DIAGNOSIS — R0789 Other chest pain: Secondary | ICD-10-CM | POA: Diagnosis present

## 2019-04-24 DIAGNOSIS — R079 Chest pain, unspecified: Secondary | ICD-10-CM | POA: Diagnosis present

## 2019-04-24 MED ORDER — ROSUVASTATIN CALCIUM 10 MG PO TABS: 10.0000 mg | ORAL_TABLET | Freq: Every day | ORAL | 3 refills | Status: DC

## 2019-04-24 MED ORDER — IOHEXOL 350 MG/ML SOLN
80.0000 mL | Freq: Once | INTRAVENOUS | Status: AC | PRN
  Administered 2019-04-24: 80 mL via INTRAVENOUS

## 2019-04-24 MED ORDER — NITROGLYCERIN 0.4 MG SL SUBL
SUBLINGUAL_TABLET | SUBLINGUAL | Status: AC
  Filled 2019-04-24: qty 2

## 2019-04-24 MED ORDER — NITROGLYCERIN 0.4 MG SL SUBL
0.8000 mg | SUBLINGUAL_TABLET | Freq: Once | SUBLINGUAL | Status: AC
  Administered 2019-04-24: 0.8 mg via SUBLINGUAL

## 2019-04-24 NOTE — Telephone Encounter (Signed)
Notified Theodore Munoz of CCTA. Minimal non-calcified plaque. Starting on crestor. Takes 81 mg ASA. Has fatty liver. Will need liver profile by PCP. Suspect will just need statin and weight loss. Will discuss further on 2.8.2021.  [name], [place]  [address], [name], [place] [address] [phone]  6:08 PM

## 2019-04-25 ENCOUNTER — Ambulatory Visit (HOSPITAL_COMMUNITY): Payer: Managed Care, Other (non HMO) | Attending: Cardiology

## 2019-04-25 DIAGNOSIS — R0789 Other chest pain: Secondary | ICD-10-CM | POA: Diagnosis present

## 2019-05-01 ENCOUNTER — Encounter: Payer: Self-pay | Admitting: Family Medicine

## 2019-05-01 ENCOUNTER — Ambulatory Visit: Payer: BC Managed Care – PPO

## 2019-05-01 ENCOUNTER — Ambulatory Visit: Payer: Managed Care, Other (non HMO) | Admitting: Family Medicine

## 2019-05-01 ENCOUNTER — Other Ambulatory Visit: Payer: Self-pay

## 2019-05-01 VITALS — BP 128/84 | HR 45 | Temp 96.3°F | Ht 69.0 in | Wt 235.0 lb

## 2019-05-01 DIAGNOSIS — F419 Anxiety disorder, unspecified: Secondary | ICD-10-CM

## 2019-05-01 DIAGNOSIS — Z125 Encounter for screening for malignant neoplasm of prostate: Secondary | ICD-10-CM

## 2019-05-01 DIAGNOSIS — F329 Major depressive disorder, single episode, unspecified: Secondary | ICD-10-CM

## 2019-05-01 DIAGNOSIS — Z0000 Encounter for general adult medical examination without abnormal findings: Secondary | ICD-10-CM | POA: Diagnosis not present

## 2019-05-01 DIAGNOSIS — F988 Other specified behavioral and emotional disorders with onset usually occurring in childhood and adolescence: Secondary | ICD-10-CM

## 2019-05-01 DIAGNOSIS — F32A Depression, unspecified: Secondary | ICD-10-CM

## 2019-05-01 DIAGNOSIS — K219 Gastro-esophageal reflux disease without esophagitis: Secondary | ICD-10-CM

## 2019-05-01 DIAGNOSIS — Z23 Encounter for immunization: Secondary | ICD-10-CM | POA: Diagnosis not present

## 2019-05-01 LAB — COMPREHENSIVE METABOLIC PANEL
ALT: 32 U/L (ref 0–53)
AST: 26 U/L (ref 0–37)
Albumin: 4.8 g/dL (ref 3.5–5.2)
Alkaline Phosphatase: 68 U/L (ref 39–117)
BUN: 18 mg/dL (ref 6–23)
CO2: 29 mEq/L (ref 19–32)
Calcium: 9.7 mg/dL (ref 8.4–10.5)
Chloride: 98 mEq/L (ref 96–112)
Creatinine, Ser: 1.04 mg/dL (ref 0.40–1.50)
GFR: 74.83 mL/min (ref >60.00)
Glucose, Bld: 100 mg/dL — ABNORMAL HIGH (ref 70–99)
Potassium: 4.6 mEq/L (ref 3.5–5.1)
Sodium: 137 mEq/L (ref 135–145)
Total Bilirubin: 1.2 mg/dL (ref 0.2–1.2)
Total Protein: 7.2 g/dL (ref 6.0–8.3)

## 2019-05-01 LAB — CBC
HCT: 46.3 % (ref 39.0–52.0)
Hemoglobin: 16 g/dL (ref 13.0–17.0)
MCHC: 34.5 g/dL (ref 30.0–36.0)
MCV: 91.3 fl (ref 78.0–100.0)
Platelets: 225 10*3/uL (ref 150.0–400.0)
RBC: 5.07 Mil/uL (ref 4.22–5.81)
RDW: 12.4 % (ref 11.5–15.5)
WBC: 5.9 10*3/uL (ref 4.0–10.5)

## 2019-05-01 LAB — LIPID PANEL
Cholesterol: 125 mg/dL (ref 0–200)
HDL: 49.8 mg/dL (ref >39.00)
LDL Cholesterol: 60 mg/dL (ref 0–99)
NonHDL: 75.67
Total CHOL/HDL Ratio: 3
Triglycerides: 80 mg/dL (ref 0.0–149.0)
VLDL: 16 mg/dL (ref 0.0–40.0)

## 2019-05-01 LAB — PSA: PSA: 0.75 ng/mL (ref 0.10–4.00)

## 2019-05-01 NOTE — Progress Notes (Signed)
Chief Complaint  Patient presents with  . Follow-up    medication for ADD  . Hypertension    wants 2nd shingles vaccine    Well Male Theodore Munoz is here for a complete physical.   His last physical was >1 year ago.  Current diet: in general, a "healthy" diet.  Current exercise: walking Weight trend: Decreasing intentionally Daytime fatigue? No. Seat belt? Yes.    Health maintenance Shingrix- Yes, received 2nd dose today Colonoscopy- Yes Tetanus- Yes HIV- Yes   Patient is following up for atypical chest pain experienced 2 weeks ago when he went to the emergency department.  He is following with a cardiologist who placed him on as omeprazole into blood pressure medicines, lisinopril and hydrochlorothiazide.  He tolerating the medicines well.  Has not had any episodes of chest pain since the incident.  He started to clean up his diet and exercise more.  He has already lost 10 pounds.  Patient has a history of ADD.  He was on Adderall when he worked in [place].  He was diagnosed by psychiatry.  His issues were daydreaming, poor focus, and a motivation.  He had associated anxiety/depression.  He is requesting to go back on Adderall.  His biggest issues now are poor motivation intermittently.  Reports his mood is up-and-down and he has intermittent anxiety as well.  Past Medical History:  Diagnosis Date  . Allergy   . Anxiety    no meds  . Arthritis    knees  . Depression    no meds  . GERD (gastroesophageal reflux disease)   . Hearing loss    no hearing aids  . Hypertension       Past Surgical History:  Procedure Laterality Date  . JOINT REPLACEMENT Bilateral    knees  . REPLACEMENT TOTAL KNEE BILATERAL      Medications  Current Outpatient Medications on File Prior to Visit  Medication Sig Dispense Refill  . acetaminophen (TYLENOL 8 HOUR) 650 MG CR tablet Take 650 mg by mouth every 8 (eight) hours as needed for pain.    Marland Kitchen esomeprazole (NEXIUM 24HR CLEAR MINIS) 20  MG capsule Take 1 capsule (20 mg total) by mouth daily at 12 noon. 90 capsule 3  . hydrochlorothiazide (HYDRODIURIL) 25 MG tablet Take 1 tablet (25 mg total) by mouth daily. 30 tablet 0  . lisinopril (ZESTRIL) 10 MG tablet Take 1 tablet (10 mg total) by mouth daily. 30 tablet 0  . rosuvastatin (CRESTOR) 10 MG tablet Take 1 tablet (10 mg total) by mouth daily. 90 tablet 3   Allergies No Known Allergies  Family History Family History  Problem Relation Age of Onset  . Alcohol abuse Mother   . Arthritis Mother   . Thyroid cancer Mother   . Depression Mother   . Anxiety disorder Mother   . Colon cancer Neg Hx   . Colon polyps Neg Hx   . Rectal cancer Neg Hx   . Stomach cancer Neg Hx     Review of Systems: Constitutional:  no fevers Eye:  no recent significant change in vision Ear/Nose/Mouth/Throat:  Ears:  no hearing loss Nose/Mouth/Throat:  no complaints of nasal congestion, no sore throat Cardiovascular:  no chest pain, no palpitations Respiratory:  no cough and no shortness of breath Gastrointestinal:  no abdominal pain, no change in bowel habits GU:  Male: negative for dysuria, frequency, and incontinence and negative for prostate symptoms Musculoskeletal/Extremities:  no pain, redness, or swelling of the  joints Integumentary (Skin/Breast):  no abnormal skin lesions reported Neurologic:  no headaches Endocrine: No unexpected weight changes Hematologic/Lymphatic:  no abnormal bleeding  Exam BP 128/84 (BP Location: Left Arm, Patient Position: Sitting, Cuff Size: Normal)   Pulse (!) 45   Temp (!) 96.3 F (35.7 C) (Temporal)   Ht 5\' 9"  (1.753 m)   Wt 235 lb (106.6 kg)   SpO2 93%   BMI 34.70 kg/m  General:  well developed, well nourished, in no apparent distress Skin:  no significant moles, warts, or growths Head:  no masses, lesions, or tenderness Eyes:  pupils equal and round, sclera anicteric without injection Ears:  canals without lesions, TMs shiny without  retraction, no obvious effusion, no erythema Nose:  nares patent, septum midline, mucosa normal Throat/Pharynx:  lips and gingiva without lesion; tongue and uvula midline; non-inflamed pharynx; no exudates or postnasal drainage Neck: neck supple without adenopathy, thyromegaly, or masses Cardiac: RRR, no bruits, no LE edema Lungs:  clear to auscultation, breath sounds equal bilaterally, no respiratory distress Rectal: Deferred Musculoskeletal:  symmetrical muscle groups noted without atrophy or deformity Neuro:  gait normal; deep tendon reflexes normal and symmetric Psych: well oriented with normal range of affect and appropriate judgment/insight  Assessment and Plan  Well adult exam - Plan: CBC, Comprehensive metabolic panel, Lipid panel  Need for shingles vaccine - Plan: Varicella-zoster vaccine IM (Shingrix)  Gastroesophageal reflux disease, unspecified whether esophagitis present  Screening for prostate cancer - Plan: PSA  Attention deficit disorder (ADD) without hyperactivity  Anxiety and depression   Well 54 y.o. male. Counseled on diet and exercise. Counseled on risks and benefits of prostate cancer screening with PSA. The patient agrees to undergo testing. I will follow up on electrolytes and renal function after initiation of the lisinopril and hydrochlorothiazide.  If his cardiologist wants me to take this over, I am happy to do so. Regarding the motivation issue, I believe this is more related to anxiety and depression.  With his recent issue with hard, I would rather not put him on a stimulant.  I offered Wellbutrin but he would prefer to take something as needed and declined.  Psychiatry contact information was also provided. For his reflux, I would have him continue the esomeprazole for 60 total days.  After that, he is to stop and see if he can stay off of the medicine or just use on an as-needed basis. Immunizations, labs, and further orders as above. Follow up in 1 yr  or prn. The patient voiced understanding and agreement to the plan.  Bessie, DO 05/01/19 11:52 AM

## 2019-05-01 NOTE — Patient Instructions (Addendum)
Keep the diet clean and stay active.  Give [place] 2-3 business days to get the results of your labs back.   Give the Nexium 60 days and then try coming off.   [facility] [address] [place] Mem[place], [place] [address] [phone]  [facility] [address] [place], [place] [address] [phone]  [facility] [place],  [address] [phone]  [facility] [address], [place], [place], [phone] [address], [place], [place], [place]  [facility] [place], [place], [place], [place]  [facility] [place], [place]  Call one of these offices sooner than later as it can take 2-3 months to get a new patient appointment.   Let us know if you need anything.

## 2019-05-14 ENCOUNTER — Other Ambulatory Visit: Payer: Self-pay

## 2019-05-14 MED ORDER — LISINOPRIL 10 MG PO TABS: 10.0000 mg | ORAL_TABLET | Freq: Every day | ORAL | 3 refills | Status: DC

## 2019-05-14 MED ORDER — LISINOPRIL 10 MG PO TABS: 10.0000 mg | ORAL_TABLET | Freq: Every day | ORAL | 0 refills | Status: DC

## 2019-05-15 MED ORDER — HYDROCHLOROTHIAZIDE 25 MG PO TABS: 25.0000 mg | ORAL_TABLET | Freq: Every day | ORAL | 1 refills | Status: DC

## 2019-05-15 NOTE — Telephone Encounter (Signed)
Theodore Munoz:  Can you refill?  -W

## 2019-05-20 NOTE — Progress Notes (Signed)
Virtual Visit via Telephone Note   This visit type was conducted due to national recommendations for restrictions regarding the COVID-19 Pandemic (e.g. social distancing) in an effort to limit this patient's exposure and mitigate transmission in our community.  Due to his co-morbid illnesses, this patient is at least at moderate risk for complications without adequate follow up.  This format is felt to be most appropriate for this patient at this time.  The patient did not have access to video technology/had technical difficulties with video requiring transitioning to audio format only (telephone).  All issues noted in this document were discussed and addressed.  No physical exam could be performed with this format.  Please refer to the patient's chart for his  consent to telehealth for [facility].   Date:  05/21/2019   ID:  Theodore Munoz, DOB 07-Apr-1966, MRN [id number]  Patient Location: Home Provider Location: Office  PCP:  Shelda Pal, DO  Cardiologist:  Evalina Field, MD   Evaluation Performed:  Follow-Up Visit  Chief Complaint:  CAD  History of Present Illness:    Theodore Munoz is a 54 y.o. male with nonobstructive CAD, hypertension who presents for follow-up of chest pain.  He was recently evaluated for atypical chest pain in the emergency room on 04/16/2019.  He was then evaluated by me on 04/17/2019.  We pursued echocardiogram which was normal.  We also pursued a coronary CTA which showed minimal plaque (less than 25%).  His dizziness was likely related to hypertension.  LDL is at goal.  Reports doing well. No further dizziness or chest pain.  He reports he has noticed a cough with his lisinopril.  It is a dry cough occurs daily.  His wife who is a pharmacist reports that lisinopril is the likely etiology here and I agree.  He reports no issues with the aspirin.  He also reports no issues tolerating his statin.  Review of laboratory data shows well-controlled LDL and he  is not diabetic.  He does have hepatic steatosis which was noticed on a CT scan.  He should remain on a cholesterol medication.  He reports he is cut back considerably on drinking and is doing much better.  He also reports he started an exercise regimen.  He reports he overall wants to improve his health and will do this by improving diet and exercising routinely.  He reports his blood pressures have been doing well.  He reports they have been in the 120/80 range.  Blood pressure today a bit elevated but did not take medications.  Problem List 1. Non-obstructive CAD -minimal (<25% stenosis) CAD CCTA 04/2019 2. Hypertension 3. Hyperlipidemia -LDL 60, A1c 5.2  The patient does not have symptoms concerning for COVID-19 infection (fever, chills, cough, or new shortness of breath).    Past Medical History:  Diagnosis Date  . Allergy   . Anxiety    no meds  . Arthritis    knees  . Depression    no meds  . GERD (gastroesophageal reflux disease)   . Hearing loss    no hearing aids  . Hypertension    Past Surgical History:  Procedure Laterality Date  . JOINT REPLACEMENT Bilateral    knees  . REPLACEMENT TOTAL KNEE BILATERAL       Current Meds  Medication Sig  . amphetamine-dextroamphetamine (ADDERALL) 10 MG tablet Take 10 mg by mouth daily as needed.  Marland Kitchen aspirin EC 81 MG tablet Take 81 mg by mouth  daily.  . esomeprazole (NEXIUM 24HR CLEAR MINIS) 20 MG capsule Take 1 capsule (20 mg total) by mouth daily at 12 noon.  . hydrochlorothiazide (HYDRODIURIL) 25 MG tablet Take 1 tablet (25 mg total) by mouth daily.  Marland Kitchen losartan (COZAAR) 25 MG tablet Take 1 tablet (25 mg total) by mouth daily.  . naproxen (NAPROSYN) 375 MG tablet Take 375 mg by mouth 2 (two) times daily with a meal.  . rosuvastatin (CRESTOR) 10 MG tablet Take 1 tablet (10 mg total) by mouth daily.  . [DISCONTINUED] acetaminophen (TYLENOL 8 HOUR) 650 MG CR tablet Take 650 mg by mouth every 8 (eight) hours as needed for pain.  .  [DISCONTINUED] lisinopril (ZESTRIL) 10 MG tablet Take 1 tablet (10 mg total) by mouth daily.     Allergies:   Patient has no known allergies.   Social History   Tobacco Use  . Smoking status: Current Some Day Smoker    Types: Cigars  . Smokeless tobacco: Never Used  . Tobacco comment: 1-2 cigars / week  Substance Use Topics  . Alcohol use: Yes    Alcohol/week: 7.0 - 9.0 standard drinks    Types: 2 Cans of beer, 5 - 7 Shots of liquor per week    Comment: beer/whiskey  . Drug use: No     Family Hx: The patient's family history includes Alcohol abuse in his mother; Anxiety disorder in his mother; Arthritis in his mother; Depression in his mother; Thyroid cancer in his mother. There is no history of Colon cancer, Colon polyps, Rectal cancer, or Stomach cancer.  ROS:   Please see the history of present illness.     All other systems reviewed and are negative.   Prior CV studies:   The following studies were reviewed today:  TTE 04/25/2019 1. Left ventricular ejection fraction, by visual estimation, is 60 to  65%. The left ventricle has normal function. Left ventricular septal wall  thickness was mildly increased. Mildly increased left ventricular  posterior wall thickness. There is no left  ventricular hypertrophy.  2. The average left ventricular global longitudinal strain is -20.4 %.  3. Left ventricular diastolic parameters are consistent with Grade I  diastolic dysfunction (impaired relaxation).  4. The left ventricle has no regional wall motion abnormalities.  5. Global right ventricle has normal systolic function.The right  ventricular size is normal. No increase in right ventricular wall  thickness.  6. Left atrial size was normal.  7. Right atrial size was normal.  8. The mitral valve is normal in structure. No evidence of mitral valve  regurgitation. No evidence of mitral stenosis.  9. The tricuspid valve is normal in structure.  10. The aortic valve is  tricuspid. Aortic valve regurgitation is trivial.  Mild aortic valve sclerosis without stenosis.  11. The pulmonic valve was normal in structure. Pulmonic valve  regurgitation is trivial.  12. The inferior vena cava is normal in size with greater than 50%  respiratory variability, suggesting right atrial pressure of 3 mmHg.   CCTA 04/24/2019 IMPRESSION: 1. Coronary calcium score of 0.  2. Normal coronary origin with right dominance.  3. Minimal non-calcified plaque in the proximal LAD and LCX (<25%).  4. Small PFO noted.  RECOMMENDATIONS: 1. Minimal non-obstructive CAD (0-24%). Consider non-atherosclerotic causes of chest pain. Consider preventive therapy and risk factor modification.  Labs/Other Tests and Data Reviewed:    EKG:  No ECG reviewed.  Recent Labs: 05/01/2019: ALT 32; BUN 18; Creatinine, Ser 1.04; Hemoglobin 16.0;  Platelets 225.0; Potassium 4.6; Sodium 137   Recent Lipid Panel Lab Results  Component Value Date/Time   CHOL 125 05/01/2019 10:30 AM   TRIG 80.0 05/01/2019 10:30 AM   HDL 49.80 05/01/2019 10:30 AM   CHOLHDL 3 05/01/2019 10:30 AM   LDLCALC 60 05/01/2019 10:30 AM    Wt Readings from Last 3 Encounters:  05/21/19 228 lb (103.4 kg)  05/01/19 235 lb (106.6 kg)  04/17/19 244 lb (110.7 kg)     Objective:    Vital Signs:  BP (!) 178/79   Ht 5\' 9"  (1.753 m)   Wt 228 lb (103.4 kg)   BMI 33.67 kg/m    VITAL SIGNS:  reviewed  Gen: no acute distress Pulm: breathing normally, talking in complete sentences CV: no palpitations Psych: normal mood, normal affect   ASSESSMENT & PLAN:    1. Coronary artery disease involving native coronary artery of native heart without angina pectoris -Minimal plaque on cardiac CTA -Continue aspirin 81 mg -Continue Crestor 10 mg daily -Most recent LDL cholesterol 60  2. Essential hypertension -Reported cough with lisinopril -We will stop that and switch him to losartan 25 mg daily  3. Mixed  hyperlipidemia -Continue Crestor 10 mg daily  4. Gastroesophageal reflux disease without esophagitis -He will maintain Nexium  5. Attention deficit hyperactivity disorder (ADHD), unspecified ADHD type -He does request a refill of this.  We will provide that today.  10 mg daily as needed of Adderall.  No refills.  He will have his primary care physician manage this moving forward.  6. Medication side effect -Change to losartan due to cough with lisinopril   COVID-19 Education: The signs and symptoms of COVID-19 were discussed with the patient and how to seek care for testing (follow up with PCP or arrange E-visit).  The importance of social distancing was discussed today.  Time:   Today, I have spent 35 minutes with the patient with telehealth technology discussing the above problems.     Medication Adjustments/Labs and Tests Ordered: Current medicines are reviewed at length with the patient today.  Concerns regarding medicines are outlined above.   Tests Ordered: No orders of the defined types were placed in this encounter.   Medication Changes: Meds ordered this encounter  Medications  . losartan (COZAAR) 25 MG tablet    Sig: Take 1 tablet (25 mg total) by mouth daily.    Dispense:  90 tablet    Refill:  3    Follow Up:  In Person in 1 year(s)  Signed, Evalina Field, MD  05/21/2019 9:25 AM    [facility]

## 2019-05-21 ENCOUNTER — Encounter: Payer: Self-pay | Admitting: Cardiovascular Disease

## 2019-05-21 ENCOUNTER — Telehealth (INDEPENDENT_AMBULATORY_CARE_PROVIDER_SITE_OTHER): Payer: Managed Care, Other (non HMO) | Admitting: Cardiovascular Disease

## 2019-05-21 VITALS — BP 178/79 | Ht 69.0 in | Wt 228.0 lb

## 2019-05-21 DIAGNOSIS — I2510 Atherosclerotic heart disease of native coronary artery without angina pectoris: Secondary | ICD-10-CM | POA: Diagnosis not present

## 2019-05-21 DIAGNOSIS — I10 Essential (primary) hypertension: Secondary | ICD-10-CM | POA: Diagnosis not present

## 2019-05-21 DIAGNOSIS — F909 Attention-deficit hyperactivity disorder, unspecified type: Secondary | ICD-10-CM

## 2019-05-21 DIAGNOSIS — T887XXA Unspecified adverse effect of drug or medicament, initial encounter: Secondary | ICD-10-CM

## 2019-05-21 DIAGNOSIS — E782 Mixed hyperlipidemia: Secondary | ICD-10-CM

## 2019-05-21 DIAGNOSIS — K219 Gastro-esophageal reflux disease without esophagitis: Secondary | ICD-10-CM

## 2019-05-21 MED ORDER — LOSARTAN POTASSIUM 25 MG PO TABS: 25.0000 mg | ORAL_TABLET | Freq: Every day | ORAL | 3 refills | Status: DC

## 2019-05-21 NOTE — Patient Instructions (Signed)
Medication Instructions:  Stop Lisinopril Start Losartan 25 mg daily. Start Adderall 10 mg daily as needed.   *If you need a refill on your cardiac medications before your next appointment, please call your pharmacy*  Follow-Up: At [facility], you and your health needs are our priority.  As part of our continuing mission to provide you with exceptional heart care, we have created designated Provider Care Teams.  These Care Teams include your primary Cardiologist (physician) and Advanced Practice Providers (APPs -  Physician Assistants and Nurse Practitioners) who all work together to provide you with the care you need, when you need it.  Your next appointment:   12 month(s)  The format for your next appointment:   Either In Person or Virtual  Provider:   Eleonore Chiquito, MD

## 2019-05-23 ENCOUNTER — Other Ambulatory Visit: Payer: Self-pay

## 2019-05-23 MED ORDER — AMPHETAMINE-DEXTROAMPHETAMINE 10 MG PO TABS: 10.0000 mg | ORAL_TABLET | Freq: Every day | ORAL | 0 refills | Status: DC | PRN

## 2019-11-14 ENCOUNTER — Other Ambulatory Visit: Payer: Self-pay | Admitting: Cardiovascular Disease

## 2020-01-31 ENCOUNTER — Other Ambulatory Visit: Payer: Managed Care, Other (non HMO)

## 2021-02-24 ENCOUNTER — Other Ambulatory Visit (HOSPITAL_BASED_OUTPATIENT_CLINIC_OR_DEPARTMENT_OTHER): Payer: Self-pay

## 2021-02-24 ENCOUNTER — Ambulatory Visit: Payer: Self-pay | Attending: Internal Medicine

## 2021-02-24 DIAGNOSIS — Z23 Encounter for immunization: Secondary | ICD-10-CM

## 2021-02-24 MED ORDER — PFIZER COVID-19 VAC BIVALENT 30 MCG/0.3ML IM SUSP
INTRAMUSCULAR | 0 refills | Status: DC
  Filled 2021-02-24: qty 0.3, 1d supply, fill #0

## 2021-02-24 NOTE — Progress Notes (Signed)
   Covid-19 Vaccination Clinic  Name:  Theodore Munoz    MRN: [id number] DOB: 03/16/1966  02/24/2021  Mr. Jamie was observed post Covid-19 immunization for 15 minutes without incident. He was provided with Vaccine Information Sheet and instruction to access the V-Safe system.   Mr. Khawaja was instructed to call 911 with any severe reactions post vaccine: Difficulty breathing  Swelling of face and throat  A fast heartbeat  A bad rash all over body  Dizziness and weakness   Immunizations Administered     Name Date Dose VIS Date Route   Pfizer Covid-19 Vaccine Bivalent Booster 02/24/2021  9:49 AM 0.3 mL 12/10/2020 Intramuscular   Manufacturer: [employer]   Lot: [id number]   Paden: [id number]

## 2021-03-09 ENCOUNTER — Ambulatory Visit: Payer: Self-pay | Admitting: Student

## 2021-03-09 DIAGNOSIS — Z01818 Encounter for other preprocedural examination: Secondary | ICD-10-CM

## 2021-03-25 NOTE — Progress Notes (Addendum)
Anesthesia Review:  PCP: DR [facility] in [place] on chart dated 03/09/21.  LOV 02/24/21. , 01/16/21,  Called and requested clearance from [facility] from [place].  LVMM Cardiologist : DR [place] Bells [place]ne[place] LOV 05/21/19  Chest x-ray : 07/15/20- CT cors  EKG :04/25/19  EKG - 01/16/21 on chart  Echo : Stress test: Cardiac Cath :  Activity level: can do a flight of stairs without difficulty  Sleep Study/ CPAP : none  Fasting Blood Sugar :      / Checks Blood Sugar -- times a day:   Blood Thinner/ Instructions /Last Dose: ASA / Instructions/ Last Dose :   No covid test ambulatory surgery  CBC done 03/27/21 routed to DR Swinteck.

## 2021-03-25 NOTE — Progress Notes (Signed)
Your procedure is scheduled on:   04/08/2021   Report to [facility] Main  Entrance   Report to admitting at    1015AM     Call this number if you have problems the morning of surgery [phone]    REMEMBER: NO  SOLID FOOD CANDY OR GUM AFTER MIDNIGHT. CLEAR LIQUIDS UNTIL   1000am       . NOTHING BY MOUTH EXCEPT CLEAR LIQUIDS UNTIL   1000am    . PLEASE FINISH ENSURE DRINK PER SURGEON ORDER  WHICH NEEDS TO BE COMPLETED AT    1000am   .      CLEAR LIQUID DIET   Foods Allowed                                                                    Coffee and tea, regular and decaf                            Fruit ices (not with fruit pulp)                                      Iced Popsicles                                    Carbonated beverages, regular and diet                                    Cranberry, grape and apple juices Sports drinks like Gatorade Lightly seasoned clear broth or consume(fat free) Sugar, honey syrup ___________________________________________________________________      BRUSH YOUR TEETH MORNING OF SURGERY AND RINSE YOUR MOUTH OUT, NO CHEWING GUM CANDY OR MINTS.     Take these medicines the morning of surgery with A SIP OF WATER:  omeprazole   DO NOT TAKE ANY DIABETIC MEDICATIONS DAY OF YOUR SURGERY                               You may not have any metal on your body including hair pins and              piercings  Do not wear jewelry, make-up, lotions, powders or perfumes, deodorant             Do not wear nail polish on your fingernails.  Do not shave  48 hours prior to surgery.              Men may shave face and neck.   Do not bring valuables to the hospital. [place].  Contacts, dentures or bridgework may not be worn into surgery.  Leave suitcase in the car. After surgery it may be brought to your room.     Patients discharged the day of surgery will not be allowed  to  drive home. IF YOU ARE HAVING SURGERY AND GOING HOME THE SAME DAY, YOU MUST HAVE AN ADULT TO DRIVE YOU HOME AND BE WITH YOU FOR 24 HOURS. YOU MAY GO HOME BY TAXI OR UBER OR ORTHERWISE, BUT AN ADULT MUST ACCOMPANY YOU HOME AND STAY WITH YOU FOR 24 HOURS.  Name and phone number of your driver:  Special Instructions: N/A              Please read over the following fact sheets you were given: _____________________________________________________________________  [facility] - Preparing for Surgery Before surgery, you can play an important role.  Because skin is not sterile, your skin needs to be as free of germs as possible.  You can reduce the number of germs on your skin by washing with CHG (chlorahexidine gluconate) soap before surgery.  CHG is an antiseptic cleaner which kills germs and bonds with the skin to continue killing germs even after washing. Please DO NOT use if you have an allergy to CHG or antibacterial soaps.  If your skin becomes reddened/irritated stop using the CHG and inform your nurse when you arrive at Short Stay. Do not shave (including legs and underarms) for at least 48 hours prior to the first CHG shower.  You may shave your face/neck. Please follow these instructions carefully:  1.  Shower with CHG Soap the night before surgery and the  morning of Surgery.  2.  If you choose to wash your hair, wash your hair first as usual with your  normal  shampoo.  3.  After you shampoo, rinse your hair and body thoroughly to remove the  shampoo.                           4.  Use CHG as you would any other liquid soap.  You can apply chg directly  to the skin and wash                       Gently with a scrungie or clean washcloth.  5.  Apply the CHG Soap to your body ONLY FROM THE NECK DOWN.   Do not use on face/ open                           Wound or open sores. Avoid contact with eyes, ears mouth and genitals (private parts).                       Wash face,  Genitals (private parts)  with your normal soap.             6.  Wash thoroughly, paying special attention to the area where your surgery  will be performed.  7.  Thoroughly rinse your body with warm water from the neck down.  8.  DO NOT shower/wash with your normal soap after using and rinsing off  the CHG Soap.                9.  Pat yourself dry with a clean towel.            10.  Wear clean pajamas.            11.  Place clean sheets on your bed the night of your first shower and do not  sleep with pets. Day of Surgery : Do not apply any lotions/deodorants  the morning of surgery.  Please wear clean clothes to the hospital/surgery center.  FAILURE TO FOLLOW THESE INSTRUCTIONS MAY RESULT IN THE CANCELLATION OF YOUR SURGERY PATIENT SIGNATURE_________________________________  NURSE SIGNATURE__________________________________  ________________________________________________________________________

## 2021-03-27 ENCOUNTER — Encounter (HOSPITAL_COMMUNITY)
Admission: RE | Admit: 2021-03-27 | Discharge: 2021-03-27 | Disposition: A | Discharge status: Home / Self Care | Payer: 59 | Source: Ambulatory Visit | Attending: Orthopedic Surgery | Admitting: Orthopedic Surgery

## 2021-03-27 ENCOUNTER — Other Ambulatory Visit: Payer: Self-pay

## 2021-03-27 ENCOUNTER — Ambulatory Visit: Payer: Self-pay | Admitting: Student

## 2021-03-27 ENCOUNTER — Encounter (HOSPITAL_COMMUNITY): Payer: Self-pay

## 2021-03-27 VITALS — BP 162/98 | HR 57 | Temp 98.5°F | Resp 16 | Ht 68.5 in | Wt 184.0 lb

## 2021-03-27 DIAGNOSIS — Z01818 Encounter for other preprocedural examination: Secondary | ICD-10-CM | POA: Diagnosis present

## 2021-03-27 LAB — COMPREHENSIVE METABOLIC PANEL
ALT: 33 U/L (ref 0–44)
AST: 23 U/L (ref 15–41)
Albumin: 4.3 g/dL (ref 3.5–5.0)
Alkaline Phosphatase: 54 U/L (ref 38–126)
Anion gap: 8 (ref 5–15)
BUN: 20 mg/dL (ref 6–20)
CO2: 27 mmol/L (ref 22–32)
Calcium: 9 mg/dL (ref 8.9–10.3)
Chloride: 101 mmol/L (ref 98–111)
Creatinine, Ser: 0.89 mg/dL (ref 0.61–1.24)
GFR, Estimated: >60 mL/min (ref >60)
Glucose, Bld: 118 mg/dL — ABNORMAL HIGH (ref 70–99)
Potassium: 3.7 mmol/L (ref 3.5–5.1)
Sodium: 136 mmol/L (ref 135–145)
Total Bilirubin: 1.1 mg/dL (ref 0.3–1.2)
Total Protein: 7.4 g/dL (ref 6.5–8.1)

## 2021-03-27 LAB — CBC
HCT: 51 % (ref 39.0–52.0)
Hemoglobin: 17.3 g/dL — ABNORMAL HIGH (ref 13.0–17.0)
MCH: 31.4 pg (ref 26.0–34.0)
MCHC: 33.9 g/dL (ref 30.0–36.0)
MCV: 92.6 fL (ref 80.0–100.0)
Platelets: 179 10*3/uL (ref 150–400)
RBC: 5.51 MIL/uL (ref 4.22–5.81)
RDW: 12.7 % (ref 11.5–15.5)
WBC: 7.9 10*3/uL (ref 4.0–10.5)
nRBC: 0 % (ref 0.0–0.2)

## 2021-03-27 LAB — TYPE AND SCREEN
ABO/RH(D): A POS
Antibody Screen: NEGATIVE

## 2021-03-27 LAB — PROTIME-INR
INR: 1 (ref 0.8–1.2)
Prothrombin Time: 13.5 seconds (ref 11.4–15.2)

## 2021-03-27 LAB — SURGICAL PCR SCREEN
MRSA, PCR: NEGATIVE
Staphylococcus aureus: NEGATIVE

## 2021-03-27 NOTE — H&P (View-Only) (Signed)
TOTAL KNEE ADMISSION H&P  Patient is being admitted for right total knee arthroplasty.  Subjective:  Chief Complaint:right knee pain.  HPI: Theodore Munoz, 55 y.o. male, has a history of pain and functional disability in the right knee due to arthritis and has failed non-surgical conservative treatments for greater than 12 weeks to includeNSAID's and/or analgesics and activity modification.  Onset of symptoms was gradual, starting 3 years ago with gradually worsening course since that time. The patient noted no past surgery on the right knee(s).  Patient currently rates pain in the right knee(s) at 9 out of 10 with activity. Patient has worsening of pain with activity and weight bearing, pain that interferes with activities of daily living, and pain with passive range of motion.  Patient has evidence of subchondral cysts, subchondral sclerosis, and joint space narrowing by imaging studies. There is no active infection.  Patient Active Problem List   Diagnosis Date Noted   Plantar fasciitis 08/25/2015   Past Medical History:  Diagnosis Date   Allergy    Anxiety    no meds   Arthritis    knees   Depression    no meds   GERD (gastroesophageal reflux disease)    Hearing loss    no hearing aids   Hypertension     Past Surgical History:  Procedure Laterality Date   JOINT REPLACEMENT Bilateral    knees   REPLACEMENT TOTAL KNEE BILATERAL      Current Outpatient Medications  Medication Sig Dispense Refill Last Dose   acetaminophen (TYLENOL) 500 MG tablet Take 1,000 mg by mouth daily.      amphetamine-dextroamphetamine (ADDERALL) 20 MG tablet Take 20 mg by mouth daily as needed (work).      COVID-19 mRNA bivalent vaccine, Pfizer, (PFIZER COVID-19 VAC BIVALENT) injection Inject into the muscle. (Patient not taking: Reported on 03/24/2021) 0.3 mL 0    hydrochlorothiazide (HYDRODIURIL) 25 MG tablet TAKE 1 TABLET(25 MG) BY MOUTH DAILY (Patient taking differently: Take 25 mg by mouth daily.)  90 tablet 1    losartan (COZAAR) 25 MG tablet Take 1 tablet (25 mg total) by mouth daily. 90 tablet 3    naproxen sodium (ALEVE) 220 MG tablet Take 440 mg by mouth daily.      omeprazole (PRILOSEC) 20 MG capsule Take 20 mg by mouth daily.      rosuvastatin (CRESTOR) 10 MG tablet Take 1 tablet (10 mg total) by mouth daily. 90 tablet 3    testosterone cypionate (DEPOTESTOSTERONE CYPIONATE) 200 MG/ML injection Inject 200 mg into the muscle every 14 (fourteen) days.      No current facility-administered medications for this visit.   Allergies  Allergen Reactions   Lisinopril Cough    Social History   Tobacco Use   Smoking status: Some Days    Types: Cigars   Smokeless tobacco: Never   Tobacco comments:    1-2 cigars / week  Substance Use Topics   Alcohol use: Yes    Alcohol/week: 7.0 - 9.0 standard drinks    Types: 2 Cans of beer, 5 - 7 Shots of liquor per week    Comment: beer/whiskey    Family History  Problem Relation Age of Onset   Alcohol abuse Mother    Arthritis Mother    Thyroid cancer Mother    Depression Mother    Anxiety disorder Mother    Colon cancer Neg Hx    Colon polyps Neg Hx    Rectal cancer Neg Hx  Stomach cancer Neg Hx      Review of Systems  Musculoskeletal:  Positive for arthralgias.  All other systems reviewed and are negative.  Objective:  Physical Exam HENT:     Head: Normocephalic.  Eyes:     Pupils: Pupils are equal, round, and reactive to light.  Cardiovascular:     Rate and Rhythm: Normal rate.  Pulmonary:     Effort: Pulmonary effort is normal.  Abdominal:     Palpations: Abdomen is soft.  Genitourinary:    Comments: Deferred Musculoskeletal:        General: Tenderness present.     Cervical back: Normal range of motion.  Skin:    General: Skin is warm.  Neurological:     Mental Status: He is alert and oriented to person, place, and time.  Psychiatric:        Behavior: Behavior normal.    Vital signs in last 24  hours: @VSRANGES @  Labs:   Estimated body mass index is 33.67 kg/m as calculated from the following:   Height as of 05/21/19: 5\' 9"  (1.753 m).   Weight as of 05/21/19: 103.4 kg.   Imaging Review Plain radiographs demonstrate severe degenerative joint disease of the right knee(s). The bone quality appears to be adequate for age and reported activity level.      Assessment/Plan:  End stage arthritis, right knee   The patient history, physical examination, clinical judgment of the provider and imaging studies are consistent with end stage degenerative joint disease of the right knee(s) and total knee arthroplasty is deemed medically necessary. The treatment options including medical management, injection therapy arthroscopy and arthroplasty were discussed at length. The risks and benefits of total knee arthroplasty were presented and reviewed. The risks due to aseptic loosening, infection, stiffness, patella tracking problems, thromboembolic complications and other imponderables were discussed. The patient acknowledged the explanation, agreed to proceed with the plan and consent was signed. Patient is being admitted for inpatient treatment for surgery, pain control, PT, OT, prophylactic antibiotics, VTE prophylaxis, progressive ambulation and ADL's and discharge planning. The patient is planning to be discharged  home same day     Patient's anticipated LOS is less than 2 midnights, meeting these requirements: - Younger than 68 - Lives within 1 hour of care - Has a competent adult at home to recover with post-op recover - NO history of  - Chronic pain requiring opiods  - Diabetes  - Coronary Artery Disease  - Heart failure  - Heart attack  - Stroke  - DVT/VTE  - Cardiac arrhythmia  - Respiratory Failure/COPD  - Renal failure  - Anemia  - Advanced Liver disease

## 2021-03-27 NOTE — H&P (Signed)
TOTAL KNEE ADMISSION H&P  Patient is being admitted for right total knee arthroplasty.  Subjective:  Chief Complaint:right knee pain.  HPI: ENRRIQUE Munoz, 55 y.o. male, has a history of pain and functional disability in the right knee due to arthritis and has failed non-surgical conservative treatments for greater than 12 weeks to includeNSAID's and/or analgesics and activity modification.  Onset of symptoms was gradual, starting 3 years ago with gradually worsening course since that time. The patient noted no past surgery on the right knee(s).  Patient currently rates pain in the right knee(s) at 9 out of 10 with activity. Patient has worsening of pain with activity and weight bearing, pain that interferes with activities of daily living, and pain with passive range of motion.  Patient has evidence of subchondral cysts, subchondral sclerosis, and joint space narrowing by imaging studies. There is no active infection.  Patient Active Problem List   Diagnosis Date Noted   Plantar fasciitis 08/25/2015   Past Medical History:  Diagnosis Date   Allergy    Anxiety    no meds   Arthritis    knees   Depression    no meds   GERD (gastroesophageal reflux disease)    Hearing loss    no hearing aids   Hypertension     Past Surgical History:  Procedure Laterality Date   JOINT REPLACEMENT Bilateral    knees   REPLACEMENT TOTAL KNEE BILATERAL      Current Outpatient Medications  Medication Sig Dispense Refill Last Dose   acetaminophen (TYLENOL) 500 MG tablet Take 1,000 mg by mouth daily.      amphetamine-dextroamphetamine (ADDERALL) 20 MG tablet Take 20 mg by mouth daily as needed (work).      COVID-19 mRNA bivalent vaccine, Pfizer, (PFIZER COVID-19 VAC BIVALENT) injection Inject into the muscle. (Patient not taking: Reported on 03/24/2021) 0.3 mL 0    hydrochlorothiazide (HYDRODIURIL) 25 MG tablet TAKE 1 TABLET(25 MG) BY MOUTH DAILY (Patient taking differently: Take 25 mg by mouth daily.)  90 tablet 1    losartan (COZAAR) 25 MG tablet Take 1 tablet (25 mg total) by mouth daily. 90 tablet 3    naproxen sodium (ALEVE) 220 MG tablet Take 440 mg by mouth daily.      omeprazole (PRILOSEC) 20 MG capsule Take 20 mg by mouth daily.      rosuvastatin (CRESTOR) 10 MG tablet Take 1 tablet (10 mg total) by mouth daily. 90 tablet 3    testosterone cypionate (DEPOTESTOSTERONE CYPIONATE) 200 MG/ML injection Inject 200 mg into the muscle every 14 (fourteen) days.      No current facility-administered medications for this visit.   Allergies  Allergen Reactions   Lisinopril Cough    Social History   Tobacco Use   Smoking status: Some Days    Types: Cigars   Smokeless tobacco: Never   Tobacco comments:    1-2 cigars / week  Substance Use Topics   Alcohol use: Yes    Alcohol/week: 7.0 - 9.0 standard drinks    Types: 2 Cans of beer, 5 - 7 Shots of liquor per week    Comment: beer/whiskey    Family History  Problem Relation Age of Onset   Alcohol abuse Mother    Arthritis Mother    Thyroid cancer Mother    Depression Mother    Anxiety disorder Mother    Colon cancer Neg Hx    Colon polyps Neg Hx    Rectal cancer Neg Hx  Stomach cancer Neg Hx      Review of Systems  Musculoskeletal:  Positive for arthralgias.  All other systems reviewed and are negative.  Objective:  Physical Exam HENT:     Head: Normocephalic.  Eyes:     Pupils: Pupils are equal, round, and reactive to light.  Cardiovascular:     Rate and Rhythm: Normal rate.  Pulmonary:     Effort: Pulmonary effort is normal.  Abdominal:     Palpations: Abdomen is soft.  Genitourinary:    Comments: Deferred Musculoskeletal:        General: Tenderness present.     Cervical back: Normal range of motion.  Skin:    General: Skin is warm.  Neurological:     Mental Status: He is alert and oriented to person, place, and time.  Psychiatric:        Behavior: Behavior normal.    Vital signs in last 24  hours: @VSRANGES @  Labs:   Estimated body mass index is 33.67 kg/m as calculated from the following:   Height as of 05/21/19: 5\' 9"  (1.753 m).   Weight as of 05/21/19: 103.4 kg.   Imaging Review Plain radiographs demonstrate severe degenerative joint disease of the right knee(s). The bone quality appears to be adequate for age and reported activity level.      Assessment/Plan:  End stage arthritis, right knee   The patient history, physical examination, clinical judgment of the provider and imaging studies are consistent with end stage degenerative joint disease of the right knee(s) and total knee arthroplasty is deemed medically necessary. The treatment options including medical management, injection therapy arthroscopy and arthroplasty were discussed at length. The risks and benefits of total knee arthroplasty were presented and reviewed. The risks due to aseptic loosening, infection, stiffness, patella tracking problems, thromboembolic complications and other imponderables were discussed. The patient acknowledged the explanation, agreed to proceed with the plan and consent was signed. Patient is being admitted for inpatient treatment for surgery, pain control, PT, OT, prophylactic antibiotics, VTE prophylaxis, progressive ambulation and ADL's and discharge planning. The patient is planning to be discharged  home same day     Patient's anticipated LOS is less than 2 midnights, meeting these requirements: - Younger than 48 - Lives within 1 hour of care - Has a competent adult at home to recover with post-op recover - NO history of  - Chronic pain requiring opiods  - Diabetes  - Coronary Artery Disease  - Heart failure  - Heart attack  - Stroke  - DVT/VTE  - Cardiac arrhythmia  - Respiratory Failure/COPD  - Renal failure  - Anemia  - Advanced Liver disease

## 2021-04-08 ENCOUNTER — Ambulatory Visit (HOSPITAL_COMMUNITY): Payer: 59

## 2021-04-08 ENCOUNTER — Encounter (HOSPITAL_COMMUNITY)
Admission: RE | Disposition: A | Discharge status: Home / Self Care | Payer: Self-pay | Source: Home / Self Care | Attending: Orthopedic Surgery

## 2021-04-08 ENCOUNTER — Ambulatory Visit (HOSPITAL_COMMUNITY)
Admission: RE | Admit: 2021-04-08 | Discharge: 2021-04-08 | Disposition: A | Discharge status: Home / Self Care | Payer: 59 | Attending: Orthopedic Surgery | Admitting: Orthopedic Surgery

## 2021-04-08 ENCOUNTER — Ambulatory Visit (HOSPITAL_COMMUNITY): Payer: 59 | Admitting: Anesthesiology

## 2021-04-08 ENCOUNTER — Ambulatory Visit (HOSPITAL_COMMUNITY): Payer: 59 | Admitting: Physician Assistant

## 2021-04-08 ENCOUNTER — Other Ambulatory Visit: Payer: Self-pay

## 2021-04-08 DIAGNOSIS — Z96651 Presence of right artificial knee joint: Secondary | ICD-10-CM

## 2021-04-08 DIAGNOSIS — F418 Other specified anxiety disorders: Secondary | ICD-10-CM | POA: Insufficient documentation

## 2021-04-08 DIAGNOSIS — T84498A Other mechanical complication of other internal orthopedic devices, implants and grafts, initial encounter: Secondary | ICD-10-CM

## 2021-04-08 DIAGNOSIS — Z96653 Presence of artificial knee joint, bilateral: Secondary | ICD-10-CM | POA: Diagnosis not present

## 2021-04-08 DIAGNOSIS — M1711 Unilateral primary osteoarthritis, right knee: Secondary | ICD-10-CM | POA: Diagnosis not present

## 2021-04-08 DIAGNOSIS — F17290 Nicotine dependence, other tobacco product, uncomplicated: Secondary | ICD-10-CM | POA: Insufficient documentation

## 2021-04-08 DIAGNOSIS — R2689 Other abnormalities of gait and mobility: Secondary | ICD-10-CM | POA: Insufficient documentation

## 2021-04-08 DIAGNOSIS — R262 Difficulty in walking, not elsewhere classified: Secondary | ICD-10-CM | POA: Insufficient documentation

## 2021-04-08 DIAGNOSIS — I10 Essential (primary) hypertension: Secondary | ICD-10-CM | POA: Insufficient documentation

## 2021-04-08 HISTORY — DX: Other mechanical complication of other internal orthopedic devices, implants and grafts, initial encounter: T84.498A

## 2021-04-08 HISTORY — PX: CONVERSION TO TOTAL KNEE: SHX5785

## 2021-04-08 LAB — ABO/RH: ABO/RH(D): A POS

## 2021-04-08 SURGERY — CONVERSION, ARTHROPLASTY, KNEE, PARTIAL, TO TOTAL KNEE ARTHROPLASTY
Anesthesia: Regional | Site: Knee | Laterality: Right

## 2021-04-08 MED ORDER — METOCLOPRAMIDE HCL 5 MG PO TABS
5.0000 mg | ORAL_TABLET | Freq: Three times a day (TID) | ORAL | Status: DC | PRN
  Filled 2021-04-08: qty 2

## 2021-04-08 MED ORDER — POVIDONE-IODINE 10 % EX SWAB: 2.0000 "application " | Freq: Once | CUTANEOUS | Status: DC

## 2021-04-08 MED ORDER — ONDANSETRON HCL 4 MG/2ML IJ SOLN
INTRAMUSCULAR | Status: DC | PRN
  Administered 2021-04-08: 4 mg via INTRAVENOUS

## 2021-04-08 MED ORDER — OXYCODONE HCL 5 MG PO TABS: 5.0000 mg | ORAL_TABLET | Freq: Once | ORAL | Status: DC | PRN

## 2021-04-08 MED ORDER — PHENYLEPHRINE 40 MCG/ML (10ML) SYRINGE FOR IV PUSH (FOR BLOOD PRESSURE SUPPORT)
PREFILLED_SYRINGE | INTRAVENOUS | Status: AC
  Filled 2021-04-08: qty 10

## 2021-04-08 MED ORDER — KETOROLAC TROMETHAMINE 15 MG/ML IJ SOLN: 15.0000 mg | Freq: Four times a day (QID) | INTRAMUSCULAR | Status: DC

## 2021-04-08 MED ORDER — ONDANSETRON HCL 4 MG PO TABS: 4.0000 mg | ORAL_TABLET | Freq: Three times a day (TID) | ORAL | 0 refills | Status: DC | PRN

## 2021-04-08 MED ORDER — PHENYLEPHRINE 40 MCG/ML (10ML) SYRINGE FOR IV PUSH (FOR BLOOD PRESSURE SUPPORT)
PREFILLED_SYRINGE | INTRAVENOUS | Status: DC | PRN
  Administered 2021-04-08: 80 ug via INTRAVENOUS

## 2021-04-08 MED ORDER — ACETAMINOPHEN 10 MG/ML IV SOLN
1000.0000 mg | Freq: Once | INTRAVENOUS | Status: AC
  Administered 2021-04-08: 13:00:00 1000 mg via INTRAVENOUS
  Filled 2021-04-08: qty 100

## 2021-04-08 MED ORDER — LACTATED RINGERS IV BOLUS
250.0000 mL | Freq: Once | INTRAVENOUS | Status: AC
  Administered 2021-04-08: 16:00:00 250 mL via INTRAVENOUS

## 2021-04-08 MED ORDER — OXYCODONE HCL 5 MG PO TABS: 10.0000 mg | ORAL_TABLET | ORAL | Status: DC | PRN

## 2021-04-08 MED ORDER — POVIDONE-IODINE 10 % EX SWAB
2.0000 "application " | Freq: Once | CUTANEOUS | Status: AC
  Administered 2021-04-08: 2 via TOPICAL

## 2021-04-08 MED ORDER — OXYCODONE HCL 5 MG PO TABS: 5.0000 mg | ORAL_TABLET | ORAL | Status: DC | PRN

## 2021-04-08 MED ORDER — CHLORHEXIDINE GLUCONATE 0.12 % MT SOLN
15.0000 mL | Freq: Once | OROMUCOSAL | Status: AC
  Administered 2021-04-08: 09:00:00 15 mL via OROMUCOSAL

## 2021-04-08 MED ORDER — DOCUSATE SODIUM 100 MG PO CAPS: 100.0000 mg | ORAL_CAPSULE | Freq: Two times a day (BID) | ORAL | 1 refills | Status: AC

## 2021-04-08 MED ORDER — SODIUM CHLORIDE 0.9 % IV SOLN: INTRAVENOUS | Status: DC

## 2021-04-08 MED ORDER — PROPOFOL 10 MG/ML IV BOLUS
INTRAVENOUS | Status: AC
  Filled 2021-04-08: qty 20

## 2021-04-08 MED ORDER — SODIUM CHLORIDE (PF) 0.9 % IJ SOLN
INTRAMUSCULAR | Status: DC | PRN
  Administered 2021-04-08: 30 mL

## 2021-04-08 MED ORDER — AMISULPRIDE (ANTIEMETIC) 5 MG/2ML IV SOLN: 10.0000 mg | Freq: Once | INTRAVENOUS | Status: DC | PRN

## 2021-04-08 MED ORDER — PROPOFOL 500 MG/50ML IV EMUL
INTRAVENOUS | Status: AC
  Filled 2021-04-08: qty 50

## 2021-04-08 MED ORDER — ONDANSETRON HCL 4 MG PO TABS
4.0000 mg | ORAL_TABLET | Freq: Four times a day (QID) | ORAL | Status: DC | PRN
  Filled 2021-04-08: qty 1

## 2021-04-08 MED ORDER — OXYCODONE HCL 5 MG PO TABS: 5.0000 mg | ORAL_TABLET | ORAL | 0 refills | Status: DC | PRN

## 2021-04-08 MED ORDER — HYDROMORPHONE HCL 1 MG/ML IJ SOLN: 0.5000 mg | INTRAMUSCULAR | Status: DC | PRN

## 2021-04-08 MED ORDER — MELOXICAM 15 MG PO TABS: 15.0000 mg | ORAL_TABLET | Freq: Every day | ORAL | 3 refills | Status: DC

## 2021-04-08 MED ORDER — ORAL CARE MOUTH RINSE: 15.0000 mL | Freq: Once | OROMUCOSAL | Status: AC

## 2021-04-08 MED ORDER — FENTANYL CITRATE PF 50 MCG/ML IJ SOSY
50.0000 ug | PREFILLED_SYRINGE | INTRAMUSCULAR | Status: DC
  Filled 2021-04-08: qty 2

## 2021-04-08 MED ORDER — ASPIRIN 81 MG PO CHEW: 81.0000 mg | CHEWABLE_TABLET | Freq: Two times a day (BID) | ORAL | 0 refills | Status: AC

## 2021-04-08 MED ORDER — ACETAMINOPHEN 500 MG PO TABS
1000.0000 mg | ORAL_TABLET | Freq: Three times a day (TID) | ORAL | 0 refills | Status: DC | PRN
Start: 2021-04-08 — End: 2023-02-10

## 2021-04-08 MED ORDER — ACETAMINOPHEN 325 MG PO TABS: 325.0000 mg | ORAL_TABLET | Freq: Four times a day (QID) | ORAL | Status: DC | PRN

## 2021-04-08 MED ORDER — PROPOFOL 10 MG/ML IV BOLUS
INTRAVENOUS | Status: DC | PRN
  Administered 2021-04-08: 20 mg via INTRAVENOUS
  Administered 2021-04-08: 50 mg via INTRAVENOUS
  Administered 2021-04-08 (×2): 20 mg via INTRAVENOUS
  Administered 2021-04-08: 30 mg via INTRAVENOUS

## 2021-04-08 MED ORDER — METHOCARBAMOL 500 MG PO TABS: 500.0000 mg | ORAL_TABLET | Freq: Four times a day (QID) | ORAL | Status: DC | PRN

## 2021-04-08 MED ORDER — SENNA 8.6 MG PO TABS: 2.0000 | ORAL_TABLET | Freq: Every day | ORAL | 1 refills | Status: AC

## 2021-04-08 MED ORDER — LACTATED RINGERS IV SOLN: INTRAVENOUS | Status: DC

## 2021-04-08 MED ORDER — PROMETHAZINE HCL 25 MG/ML IJ SOLN: 6.2500 mg | INTRAMUSCULAR | Status: DC | PRN

## 2021-04-08 MED ORDER — METHOCARBAMOL 500 MG IVPB - SIMPLE MED: 500.0000 mg | Freq: Four times a day (QID) | INTRAVENOUS | Status: DC | PRN

## 2021-04-08 MED ORDER — METOCLOPRAMIDE HCL 5 MG/ML IJ SOLN: 5.0000 mg | Freq: Three times a day (TID) | INTRAMUSCULAR | Status: DC | PRN

## 2021-04-08 MED ORDER — 0.9 % SODIUM CHLORIDE (POUR BTL) OPTIME
TOPICAL | Status: DC | PRN
  Administered 2021-04-08: 12:00:00 1000 mL

## 2021-04-08 MED ORDER — ONDANSETRON HCL 4 MG/2ML IJ SOLN: 4.0000 mg | Freq: Four times a day (QID) | INTRAMUSCULAR | Status: DC | PRN

## 2021-04-08 MED ORDER — BUPIVACAINE IN DEXTROSE 0.75-8.25 % IT SOLN
INTRATHECAL | Status: DC | PRN
  Administered 2021-04-08: 1.8 mL via INTRATHECAL

## 2021-04-08 MED ORDER — LACTATED RINGERS IV BOLUS
500.0000 mL | Freq: Once | INTRAVENOUS | Status: AC
  Administered 2021-04-08: 14:00:00 500 mL via INTRAVENOUS

## 2021-04-08 MED ORDER — SODIUM CHLORIDE 0.9 % IR SOLN
Status: DC | PRN
  Administered 2021-04-08: 2000 mL

## 2021-04-08 MED ORDER — STERILE WATER FOR IRRIGATION IR SOLN
Status: DC | PRN
  Administered 2021-04-08: 2000 mL

## 2021-04-08 MED ORDER — BUPIVACAINE-EPINEPHRINE (PF) 0.5% -1:200000 IJ SOLN
INTRAMUSCULAR | Status: DC | PRN
  Administered 2021-04-08: 30 mL via PERINEURAL

## 2021-04-08 MED ORDER — PROPOFOL 500 MG/50ML IV EMUL
INTRAVENOUS | Status: DC | PRN
  Administered 2021-04-08: 75 ug/kg/min via INTRAVENOUS

## 2021-04-08 MED ORDER — CEFAZOLIN SODIUM-DEXTROSE 2-4 GM/100ML-% IV SOLN: 2.0000 g | Freq: Four times a day (QID) | INTRAVENOUS | Status: DC

## 2021-04-08 MED ORDER — PHENYLEPHRINE HCL-NACL 20-0.9 MG/250ML-% IV SOLN
INTRAVENOUS | Status: DC | PRN
  Administered 2021-04-08: 25 ug/min via INTRAVENOUS

## 2021-04-08 MED ORDER — FENTANYL CITRATE PF 50 MCG/ML IJ SOSY: 25.0000 ug | PREFILLED_SYRINGE | INTRAMUSCULAR | Status: DC | PRN

## 2021-04-08 MED ORDER — TRANEXAMIC ACID-NACL 1000-0.7 MG/100ML-% IV SOLN
1000.0000 mg | INTRAVENOUS | Status: AC
  Administered 2021-04-08: 11:00:00 1000 mg via INTRAVENOUS
  Filled 2021-04-08: qty 100

## 2021-04-08 MED ORDER — DEXAMETHASONE SODIUM PHOSPHATE 10 MG/ML IJ SOLN
INTRAMUSCULAR | Status: DC | PRN
  Administered 2021-04-08: 10 mg via INTRAVENOUS

## 2021-04-08 MED ORDER — CEFAZOLIN SODIUM-DEXTROSE 2-4 GM/100ML-% IV SOLN
2.0000 g | INTRAVENOUS | Status: AC
  Administered 2021-04-08: 11:00:00 2 g via INTRAVENOUS
  Filled 2021-04-08: qty 100

## 2021-04-08 MED ORDER — PROPOFOL 10 MG/ML IV BOLUS: INTRAVENOUS | Status: DC | PRN

## 2021-04-08 MED ORDER — KETOROLAC TROMETHAMINE 30 MG/ML IJ SOLN
INTRAMUSCULAR | Status: AC
  Filled 2021-04-08: qty 1

## 2021-04-08 MED ORDER — MIDAZOLAM HCL 2 MG/2ML IJ SOLN
1.0000 mg | INTRAMUSCULAR | Status: DC
Start: 2021-04-08 — End: 2021-04-08
  Administered 2021-04-08: 10:00:00 2 mg via INTRAVENOUS
  Filled 2021-04-08: qty 2

## 2021-04-08 MED ORDER — BUPIVACAINE-EPINEPHRINE (PF) 0.25% -1:200000 IJ SOLN
INTRAMUSCULAR | Status: AC
  Filled 2021-04-08: qty 30

## 2021-04-08 MED ORDER — KETOROLAC TROMETHAMINE 30 MG/ML IJ SOLN
INTRAMUSCULAR | Status: DC | PRN
  Administered 2021-04-08: 30 mg

## 2021-04-08 MED ORDER — BUPIVACAINE-EPINEPHRINE 0.25% -1:200000 IJ SOLN
INTRAMUSCULAR | Status: DC | PRN
  Administered 2021-04-08: 30 mL

## 2021-04-08 MED ORDER — LACTATED RINGERS IV BOLUS
250.0000 mL | Freq: Once | INTRAVENOUS | Status: AC
  Administered 2021-04-08: 15:00:00 250 mL via INTRAVENOUS

## 2021-04-08 MED ORDER — OXYCODONE HCL 5 MG/5ML PO SOLN: 5.0000 mg | Freq: Once | ORAL | Status: DC | PRN

## 2021-04-08 MED ORDER — ISOPROPYL ALCOHOL 70 % SOLN
Status: DC | PRN
  Administered 2021-04-08: 1 via TOPICAL

## 2021-04-08 SURGICAL SUPPLY — 69 items
BAG COUNTER SPONGE SURGICOUNT (BAG) IMPLANT
BAG SURGICOUNT SPONGE COUNTING (BAG)
BAG ZIPLOCK 12X15 (MISCELLANEOUS) IMPLANT
BATTERY INSTRU NAVIGATION (MISCELLANEOUS) ×9 IMPLANT
BLADE SAW RECIPROCATING 77.5 (BLADE) ×3 IMPLANT
BNDG ELASTIC 4X5.8 VLCR STR LF (GAUZE/BANDAGES/DRESSINGS) ×3 IMPLANT
BNDG ELASTIC 6X5.8 VLCR STR LF (GAUZE/BANDAGES/DRESSINGS) ×3 IMPLANT
CHLORAPREP W/TINT 26 (MISCELLANEOUS) ×6 IMPLANT
COMPONENT TRI CR FEM SZ5 KNEE (Orthopedic Implant) IMPLANT
COVER SURGICAL LIGHT HANDLE (MISCELLANEOUS) ×3 IMPLANT
DECANTER SPIKE VIAL GLASS SM (MISCELLANEOUS) ×6 IMPLANT
DERMABOND ADVANCED (GAUZE/BANDAGES/DRESSINGS) ×4
DERMABOND ADVANCED .7 DNX12 (GAUZE/BANDAGES/DRESSINGS) ×2 IMPLANT
DRAPE INCISE IOBAN 66X45 STRL (DRAPES) ×9 IMPLANT
DRAPE SHEET LG 3/4 BI-LAMINATE (DRAPES) ×9 IMPLANT
DRAPE U-SHAPE 47X51 STRL (DRAPES) ×3 IMPLANT
DRSG AQUACEL AG ADV 3.5X14 (GAUZE/BANDAGES/DRESSINGS) ×3 IMPLANT
ELECT BLADE TIP CTD 4 INCH (ELECTRODE) ×3 IMPLANT
ELECT REM PT RETURN 15FT ADLT (MISCELLANEOUS) ×3 IMPLANT
GAUZE SPONGE 4X4 12PLY STRL (GAUZE/BANDAGES/DRESSINGS) ×3 IMPLANT
GLOVE SRG 8 PF TXTR STRL LF DI (GLOVE) ×2 IMPLANT
GLOVE SURG ENC MOIS LTX SZ8.5 (GLOVE) ×6 IMPLANT
GLOVE SURG ENC TEXT LTX SZ7.5 (GLOVE) ×9 IMPLANT
GLOVE SURG UNDER POLY LF SZ8 (GLOVE) ×4
GLOVE SURG UNDER POLY LF SZ8.5 (GLOVE) ×3 IMPLANT
GOWN SPEC L3 XXLG W/TWL (GOWN DISPOSABLE) ×3 IMPLANT
GOWN STRL REUS W/TWL XL LVL3 (GOWN DISPOSABLE) ×3 IMPLANT
HANDPIECE INTERPULSE COAX TIP (DISPOSABLE) ×2
HOLDER FOLEY CATH W/STRAP (MISCELLANEOUS) ×3 IMPLANT
HOOD PEEL AWAY FLYTE STAYCOOL (MISCELLANEOUS) ×9 IMPLANT
INSERT BEARNG TRI TIB SZ4 KNEE (Insert) IMPLANT
JET LAVAGE IRRISEPT WOUND (IRRIGATION / IRRIGATOR)
KIT TURNOVER KIT A (KITS) IMPLANT
KNEE PATELLA ASYMMETRIC 10X32 (Knees) ×2 IMPLANT
KNEE TIBIAL COMP TRI SZ4 (Knees) ×2 IMPLANT
LAVAGE JET IRRISEPT WOUND (IRRIGATION / IRRIGATOR) IMPLANT
MARKER SKIN DUAL TIP RULER LAB (MISCELLANEOUS) ×3 IMPLANT
NDL SAFETY ECLIPSE 18X1.5 (NEEDLE) ×1 IMPLANT
NDL SPNL 18GX3.5 QUINCKE PK (NEEDLE) ×1 IMPLANT
NEEDLE HYPO 18GX1.5 SHARP (NEEDLE) ×2
NEEDLE SPNL 18GX3.5 QUINCKE PK (NEEDLE) ×3 IMPLANT
NS IRRIG 1000ML POUR BTL (IV SOLUTION) ×3 IMPLANT
PACK TOTAL KNEE CUSTOM (KITS) ×3 IMPLANT
PAD CAST 4YDX4 CTTN HI CHSV (CAST SUPPLIES) IMPLANT
PADDING CAST COTTON 4X4 STRL (CAST SUPPLIES) ×2
PADDING CAST COTTON 6X4 STRL (CAST SUPPLIES) ×3 IMPLANT
PIN FLUTED HEDLESS FIX 3.5X1/8 (PIN) ×4 IMPLANT
PROTECTOR NERVE ULNAR (MISCELLANEOUS) ×3 IMPLANT
SAW OSC TIP CART 19.5X105X1.3 (SAW) ×3 IMPLANT
SEALER BIPOLAR AQUA 6.0 (INSTRUMENTS) ×3 IMPLANT
SET HNDPC FAN SPRY TIP SCT (DISPOSABLE) ×1 IMPLANT
SET PAD KNEE POSITIONER (MISCELLANEOUS) ×3 IMPLANT
SPONGE T-LAP 18X18 ~~LOC~~+RFID (SPONGE) ×9 IMPLANT
SUT MNCRL AB 3-0 PS2 18 (SUTURE) ×3 IMPLANT
SUT MNCRL AB 4-0 PS2 18 (SUTURE) ×3 IMPLANT
SUT MON AB 2-0 CT1 36 (SUTURE) ×3 IMPLANT
SUT STRATAFIX PDO 1 14 VIOLET (SUTURE) ×2
SUT STRATFX PDO 1 14 VIOLET (SUTURE) ×1
SUT VIC AB 1 CTX 36 (SUTURE) ×4
SUT VIC AB 1 CTX36XBRD ANBCTR (SUTURE) ×2 IMPLANT
SUT VIC AB 2-0 CT1 27 (SUTURE) ×2
SUT VIC AB 2-0 CT1 TAPERPNT 27 (SUTURE) ×1 IMPLANT
SUTURE STRATFX PDO 1 14 VIOLET (SUTURE) ×1 IMPLANT
TRAY FOLEY MTR SLVR 16FR STAT (SET/KITS/TRAYS/PACK) IMPLANT
TRI CRUC RET FEM SZ5 KNEE (Orthopedic Implant) ×3 IMPLANT
TRI TIB BEAR INSERT SZ4 KNEE (Insert) ×3 IMPLANT
TUBE SUCTION HIGH CAP CLEAR NV (SUCTIONS) ×3 IMPLANT
WATER STERILE IRR 1000ML POUR (IV SOLUTION) ×6 IMPLANT
WRAP KNEE MAXI GEL POST OP (GAUZE/BANDAGES/DRESSINGS) ×2 IMPLANT

## 2021-04-08 NOTE — Anesthesia Procedure Notes (Signed)
Spinal  Patient location during procedure: OR Start time: 04/08/2021 10:42 AM End time: 04/08/2021 10:46 AM Reason for block: surgical anesthesia Staffing Performed: resident/CRNA  Resident/CRNA: Sharlette Dense, CRNA Preanesthetic Checklist Completed: patient identified, IV checked, site marked, risks and benefits discussed, surgical consent, monitors and equipment checked, pre-op evaluation and timeout performed Spinal Block Patient position: sitting Prep: DuraPrep and site prepped and draped Patient monitoring: heart rate, continuous pulse ox and blood pressure Approach: midline Location: L3-4 Injection technique: single-shot Needle Needle type: Pencan  Needle gauge: 24 G Needle length: 9 cm Additional Notes Kit expiration date 08/10/2022 and lot # [id number] Clear free flow CSF, negative heme, negative paresthesia Tolerated well, placed in right lateral position for 2 minutes Placed in supine position

## 2021-04-08 NOTE — Anesthesia Preprocedure Evaluation (Addendum)
Anesthesia Evaluation  Patient identified by MRN, date of birth, ID band Patient awake    Reviewed: Allergy & Precautions, NPO status , Patient's Chart, lab work & pertinent test results  Airway Mallampati: III  TM Distance: >3 FB Neck ROM: Full    Dental  (+) Chipped,    Pulmonary neg pulmonary ROS,    Pulmonary exam normal breath sounds clear to auscultation       Cardiovascular hypertension, Pt. on medications Normal cardiovascular exam Rhythm:Regular Rate:Normal  ECG: NSR, rate 62   Neuro/Psych PSYCHIATRIC DISORDERS Anxiety Depression negative neurological ROS     GI/Hepatic Neg liver ROS, GERD  Medicated and Controlled,  Endo/Other  negative endocrine ROS  Renal/GU negative Renal ROS     Musculoskeletal  (+) Arthritis ,   Abdominal   Peds  Hematology negative hematology ROS (+)   Anesthesia Other Findings Failed right knee patellofemoral arthroplasty  Reproductive/Obstetrics                            Anesthesia Physical Anesthesia Plan  ASA: 2  Anesthesia Plan: Spinal and Regional   Post-op Pain Management:    Induction: Intravenous  PONV Risk Score and Plan: 1 and Ondansetron, Dexamethasone, Midazolam and Treatment may vary due to age or medical condition  Airway Management Planned: Simple Face Mask  Additional Equipment:   Intra-op Plan:   Post-operative Plan:   Informed Consent: I have reviewed the patients History and Physical, chart, labs and discussed the procedure including the risks, benefits and alternatives for the proposed anesthesia with the patient or authorized representative who has indicated his/her understanding and acceptance.     Dental advisory given  Plan Discussed with: CRNA  Anesthesia Plan Comments:         Anesthesia Quick Evaluation

## 2021-04-08 NOTE — Evaluation (Signed)
Physical Therapy Evaluation Patient Details Name: Theodore Munoz MRN: [id number] DOB: 02-19-1966 Today's Date: 04/08/2021  History of Present Illness  55 yo male s/p Revision right knee patellofemoral arthroplasty to Right total knee arthroplasty per Dr. Lyla Glassing on 04/08/21. PMH: HTN, depression, OA, knee surgery  Clinical Impression  Patient evaluated by Physical Therapy with no further acute PT needs identified. All education has been completed and the patient has no further questions.  Reviewed mobility areas as noted below, knee precautions. Pt is ready to d/c from PT standpoint with family assist prn.  See below for any follow-up Physical Therapy or equipment needs. PT is signing off. Thank you for this referral.        Recommendations for follow up therapy are one component of a multi-disciplinary discharge planning process, led by the attending physician.  Recommendations may be updated based on patient status, additional functional criteria and insurance authorization.  Follow Up Recommendations Follow physician's recommendations for discharge plan and follow up therapies    Assistance Recommended at Discharge Intermittent Supervision/Assistance  Functional Status Assessment Patient has had a recent decline in their functional status and demonstrates the ability to make significant improvements in function in a reasonable and predictable amount of time.  Equipment Recommendations  None recommended by PT    Recommendations for Other Services       Precautions / Restrictions Precautions Precautions: Knee;Fall Restrictions Other Position/Activity Restrictions: WBAT      Mobility  Bed Mobility Overal bed mobility: Modified Independent                  Transfers Overall transfer level: Needs assistance Equipment used: Rolling walker (2 wheels) Transfers: Sit to/from Stand Sit to Stand: Supervision           General transfer comment: cues for hand  placement    Ambulation/Gait Ambulation/Gait assistance: Supervision;Min guard Gait Distance (Feet): 200 Feet Assistive device: Rolling walker (2 wheels) Gait Pattern/deviations: Step-to pattern;Decreased stance time - right       General Gait Details: cues for sequence and RW position  Stairs Stairs: Yes Stairs assistance: Min guard Stair Management: Step to pattern Number of Stairs: 2 General stair comments: cues for sequence and technique. good stability, no knee buckling  Wheelchair Mobility    Modified Rankin (Stroke Patients Only)       Balance     Sitting balance-Leahy Scale: Good       Standing balance-Leahy Scale: Fair                               Pertinent Vitals/Pain Pain Assessment: No/denies pain    Home Living Family/patient expects to be discharged to:: Private residence Living Arrangements: Spouse/significant other Available Help at Discharge: Family Type of Home: House Home Access: Stairs to enter   Technical brewer of Steps: 1   Home Layout: Able to live on main level with bedroom/bathroom;Two level Home Equipment: Conservation officer, nature (2 wheels);Cane - single point      Prior Function Prior Level of Function : Independent/Modified Independent                     Hand Dominance        Extremity/Trunk Assessment   Upper Extremity Assessment Upper Extremity Assessment: Overall WFL for tasks assessed    Lower Extremity Assessment Lower Extremity Assessment: RLE deficits/detail RLE Deficits / Details: ankle WFL, knee extension and hip flexion 3/5  Communication   Communication: No difficulties  Cognition Arousal/Alertness: Awake/alert Behavior During Therapy: WFL for tasks assessed/performed Overall Cognitive Status: Within Functional Limits for tasks assessed                                          General Comments      Exercises Total Joint Exercises Ankle Circles/Pumps:  AROM;Both;10 reps Quad Sets: 10 reps;Both;AROM Heel Slides: AROM;AAROM;Right;10 reps Hip ABduction/ADduction: AROM;Right;10 reps Straight Leg Raises: AROM;Right;10 reps Goniometric ROM: grossly 5 to 80 degrees right knee flexion   Assessment/Plan    PT Assessment All further PT needs can be met in the next venue of care  PT Problem List         PT Treatment Interventions      PT Goals (Current goals can be found in the Care Plan section)  Acute Rehab PT Goals Patient Stated Goal: home today, have less knee pain PT Goal Formulation: All assessment and education complete, DC therapy    Frequency     Barriers to discharge        Co-evaluation               AM-PAC PT "6 Clicks" Mobility  Outcome Measure Help needed turning from your back to your side while in a flat bed without using bedrails?: None Help needed moving from lying on your back to sitting on the side of a flat bed without using bedrails?: None Help needed moving to and from a bed to a chair (including a wheelchair)?: A Little Help needed standing up from a chair using your arms (e.g., wheelchair or bedside chair)?: A Little Help needed to walk in hospital room?: A Little Help needed climbing 3-5 steps with a railing? : A Little 6 Click Score: 20    End of Session Equipment Utilized During Treatment: Gait belt Activity Tolerance: Patient tolerated treatment well Patient left: with call bell/phone within reach;in bed Nurse Communication: Mobility status PT Visit Diagnosis: Other abnormalities of gait and mobility (R26.89);Difficulty in walking, not elsewhere classified (R26.2)    Time: 0211-1552 PT Time Calculation (min) (ACUTE ONLY): 24 min   Charges:   PT Evaluation $PT Eval Low Complexity: 1 Low PT Treatments $Gait Training: 8-22 mins        Baxter Flattery, PT  Acute Rehab Dept ([place]) [phone] Pager [phone]  04/08/2021   [facility] 04/08/2021, 4:43 PM

## 2021-04-08 NOTE — Interval H&P Note (Signed)
History and Physical Interval Note:  04/08/2021 9:43 AM  Oneal Deputy  has presented today for surgery, with the diagnosis of Failed right knee patellofemoral arthroplasty.  The various methods of treatment have been discussed with the patient and family. After consideration of risks, benefits and other options for treatment, the patient has consented to  Procedure(s): CONVERSION TO TOTAL KNEE (Right) as a surgical intervention.  The patient's history has been reviewed, patient examined, no change in status, stable for surgery.  I have reviewed the patient's chart and labs.  Questions were answered to the patient's satisfaction.     Hilton Cork Nasean Zapf

## 2021-04-08 NOTE — Transfer of Care (Signed)
Immediate Anesthesia Transfer of Care Note  Patient: Theodore Munoz  Procedure(s) Performed: CONVERSION TO TOTAL KNEE (Right: Knee)  Patient Location: PACU  Anesthesia Type:Spinal  Level of Consciousness: awake  Airway & Oxygen Therapy: Patient Spontanous Breathing and Patient connected to face mask oxygen  Post-op Assessment: Report given to RN and Post -op Vital signs reviewed and stable  Post vital signs: Reviewed and stable  Last Vitals:  Vitals Value Taken Time  BP    Temp    Pulse 78 04/08/21 1345  Resp 25 04/08/21 1345  SpO2 90 % 04/08/21 1345  Vitals shown include unvalidated device data.  Last Pain:  Vitals:   04/08/21 0810  TempSrc: Oral         Complications: No notable events documented.

## 2021-04-08 NOTE — Anesthesia Postprocedure Evaluation (Signed)
Anesthesia Post Note  Patient: Erickson Yamashiro Deremer  Procedure(s) Performed: CONVERSION TO TOTAL KNEE (Right: Knee)     Patient location during evaluation: PACU Anesthesia Type: Regional and Spinal Level of consciousness: awake Pain management: pain level controlled Vital Signs Assessment: post-procedure vital signs reviewed and stable Respiratory status: spontaneous breathing, respiratory function stable and patient connected to nasal cannula oxygen Cardiovascular status: blood pressure returned to baseline and stable Postop Assessment: no headache, no backache and no apparent nausea or vomiting Anesthetic complications: no   No notable events documented.  Last Vitals:  Vitals:   04/08/21 1530 04/08/21 1600  BP: (!) 152/93 (!) 159/96  Pulse:    Resp: 11 12  Temp:    SpO2: 98% 98%    Last Pain:  Vitals:   04/08/21 1600  TempSrc:   PainSc: 0-No pain                 Mohini Heathcock P Chukwuemeka Artola

## 2021-04-08 NOTE — Anesthesia Procedure Notes (Signed)
Anesthesia Regional Block: Adductor canal block   Pre-Anesthetic Checklist: , timeout performed,  Correct Patient, Correct Site, Correct Laterality,  Correct Procedure,, site marked,  Risks and benefits discussed,  Surgical consent,  Pre-op evaluation,  At surgeon's request and post-op pain management  Laterality: Right  Prep: chloraprep       Needles:  Injection technique: Single-shot  Needle Type: Echogenic Stimulator Needle     Needle Length: 9cm  Needle Gauge: 21     Additional Needles:   Procedures:,,,, ultrasound used (permanent image in chart),,    Narrative:  Start time: 04/08/2021 9:35 AM End time: 04/08/2021 9:45 AM Injection made incrementally with aspirations every 5 mL.  Performed by: Personally  Anesthesiologist: Murvin Natal, MD  Additional Notes: Functioning IV was confirmed and monitors were applied. A time-out was performed. Hand hygiene and sterile gloves were used. The thigh was placed in a frog-leg position and prepped in a sterile fashion. A 10mm 21ga Arrow echogenic stimulator needle was placed using ultrasound guidance.  Negative aspiration and negative test dose prior to incremental administration of local anesthetic. The patient tolerated the procedure well.

## 2021-04-08 NOTE — Anesthesia Procedure Notes (Signed)
Date/Time: 04/08/2021 10:41 AM Performed by: Sharlette Dense, CRNA Oxygen Delivery Method: Simple face mask

## 2021-04-08 NOTE — Progress Notes (Signed)
AssistedDr. Ellender with right, ultrasound guided, adductor canal block. Side rails up, monitors on throughout procedure. See vital signs in flow sheet. Tolerated Procedure well.  

## 2021-04-08 NOTE — Discharge Instructions (Signed)
 Dr. Rollande Thursby Total Joint Specialist [facility] [address] [place], [place] [address] [phone]  TOTAL KNEE REPLACEMENT POSTOPERATIVE DIRECTIONS    Knee Rehabilitation, Guidelines Following Surgery  Results after knee surgery are often greatly improved when you follow the exercise, range of motion and muscle strengthening exercises prescribed by your doctor. Safety measures are also important to protect the knee from further injury. Any time any of these exercises cause you to have increased pain or swelling in your knee joint, decrease the amount until you are comfortable again and slowly increase them. If you have problems or questions, call your caregiver or physical therapist for advice.   WEIGHT BEARING Weight bearing as tolerated with assist device (walker, cane, etc) as directed, use it as long as suggested by your surgeon or therapist, typically at least 4-6 weeks.  HOME CARE INSTRUCTIONS  Remove items at home which could result in a fall. This includes throw rugs or furniture in walking pathways.  Continue medications as instructed at time of discharge. You may have some home medications which will be placed on hold until you complete the course of blood thinner medication.  You may start showering once you are discharged home but do not submerge the incision under water. Just pat the incision dry and apply a dry gauze dressing on daily. Walk with walker as instructed.  You may resume a sexual relationship in one month or when given the OK by your doctor.  Use walker as long as suggested by your caregivers. Avoid periods of inactivity such as sitting longer than an hour when not asleep. This helps prevent blood clots.  You may put full weight on your legs and walk as much as is comfortable.  You may return to work once you are cleared by your doctor.  Do not drive a car for 6 weeks or until released by you surgeon.  Do not drive while  taking narcotics.  Wear the elastic stockings for three weeks following surgery during the day but you may remove then at night. Make sure you keep all of your appointments after your operation with all of your doctors and caregivers. You should call the office at the above phone number and make an appointment for approximately two weeks after the date of your surgery. Do not remove your surgical dressing. The dressing is waterproof; you may take showers in 3 days, but do not take tub baths or submerge the dressing. Please pick up a stool softener and laxative for home use as long as you are requiring pain medications. ICE to the affected knee every three hours for 30 minutes at a time and then as needed for pain and swelling.  Continue to use ice on the knee for pain and swelling from surgery. You may notice swelling that will progress down to the foot and ankle.  This is normal after surgery.  Elevate the leg when you are not up walking on it.   It is important for you to complete the blood thinner medication as prescribed by your doctor. Continue to use the breathing machine which will help keep your temperature down.  It is common for your temperature to cycle up and down following surgery, especially at night when you are not up moving around and exerting yourself.  The breathing machine keeps your lungs expanded and your temperature down.  RANGE OF MOTION AND STRENGTHENING EXERCISES  Rehabilitation of the knee is important following a knee injury or an   operation. After just a few days of immobilization, the muscles of the thigh which control the knee become weakened and shrink (atrophy). Knee exercises are designed to build up the tone and strength of the thigh muscles and to improve knee motion. Often times heat used for twenty to thirty minutes before working out will loosen up your tissues and help with improving the range of motion but do not use heat for the first two weeks following surgery.  These exercises can be done on a training (exercise) mat, on the floor, on a table or on a bed. Use what ever works the best and is most comfortable for you Knee exercises include:  Leg Lifts - While your knee is still immobilized in a splint or cast, you can do straight leg raises. Lift the leg to 60 degrees, hold for 3 sec, and slowly lower the leg. Repeat 10-20 times 2-3 times daily. Perform this exercise against resistance later as your knee gets better.  Quad and Hamstring Sets - Tighten up the muscle on the front of the thigh (Quad) and hold for 5-10 sec. Repeat this 10-20 times hourly. Hamstring sets are done by pushing the foot backward against an object and holding for 5-10 sec. Repeat as with quad sets.  A rehabilitation program following serious knee injuries can speed recovery and prevent re-injury in the future due to weakened muscles. Contact your doctor or a physical therapist for more information on knee rehabilitation.   POST-OPERATIVE OPIOID TAPER INSTRUCTIONS: It is important to wean off of your opioid medication as soon as possible. If you do not need pain medication after your surgery it is ok to stop day one. Opioids include: Codeine, Hydrocodone(Norco, Vicodin), Oxycodone(Percocet, oxycontin) and hydromorphone amongst others.  Long term and even short term use of opiods can cause: Increased pain response Dependence Constipation Depression Respiratory depression And more.  Withdrawal symptoms can include Flu like symptoms Nausea, vomiting And more Techniques to manage these symptoms Hydrate well Eat regular healthy meals Stay active Use relaxation techniques(deep breathing, meditating, yoga) Do Not substitute Alcohol to help with tapering If you have been on opioids for less than two weeks and do not have pain than it is ok to stop all together.  Plan to wean off of opioids This plan should start within one week post op of your joint replacement. Maintain the same  interval or time between taking each dose and first decrease the dose.  Cut the total daily intake of opioids by one tablet each day Next start to increase the time between doses. The last dose that should be eliminated is the evening dose.    SKILLED REHAB INSTRUCTIONS: If the patient is transferred to a skilled rehab facility following release from the hospital, a list of the current medications will be sent to the facility for the patient to continue.  When discharged from the skilled rehab facility, please have the facility set up the patient's Home Health Physical Therapy prior to being released. Also, the skilled facility will be responsible for providing the patient with their medications at time of release from the facility to include their pain medication, the muscle relaxants, and their blood thinner medication. If the patient is still at the rehab facility at time of the two week follow up appointment, the skilled rehab facility will also need to assist the patient in arranging follow up appointment in our office and any transportation needs.  MAKE SURE YOU:  Understand these instructions.  Will watch   your condition.  Will get help right away if you are not doing well or get worse.    Pick up stool softner and laxative for home use following surgery while on pain medications. Do NOT remove your dressing. You may shower.  Do not take tub baths or submerge incision under water. May shower starting three days after surgery. Please use a clean towel to pat the incision dry following showers. Continue to use ice for pain and swelling after surgery. Do not use any lotions or creams on the incision until instructed by your surgeon.  

## 2021-04-08 NOTE — Op Note (Signed)
OPERATIVE REPORT  SURGEON: Rod Can, MD   ASSISTANT: Cherlynn June  PREOPERATIVE DIAGNOSIS: 1. Failed right patellofemoral arthroplasty. 2. Right knee arthritis.   POSTOPERATIVE DIAGNOSIS: 1. Failed right patellofemoral arthroplasty. 2. Right knee arthritis.   PROCEDURE: 1. Revision right knee patellofemoral arthroplasty to Right total knee arthroplasty.   IMPLANTS: Stryker Triathlon CR femur, size 5. Stryker Tritanium tibia, size 4. X3 polyethelyene insert, size 11 mm, CR. 3 button asymmetric patella, size 32 mm.  EXPLANTS: Arthrosurface patellar button, trochlear implant.  ANESTHESIA:  MAC, Regional, and Spinal  TOURNIQUET TIME: Not utilized.   ESTIMATED BLOOD LOSS:-150 mL    ANTIBIOTICS: 2 g Ancef.  DRAINS: None.  COMPLICATIONS: None   CONDITION: PACU - hemodynamically stable.   BRIEF CLINICAL NOTE: Theodore Munoz is a 55 y.o. male with a failed Right knee patellofemoral arthroplasty and tricompartmental arthritis. After failing conservative management, the patient was indicated for revision to total knee arthroplasty. The risks, benefits, and alternatives to the procedure were explained, and the patient elected to proceed.  PROCEDURE IN DETAIL: Adductor canal block was obtained in the pre-op holding area. Once inside the operative room, spinal anesthesia was obtained, and a foley catheter was inserted. The patient was then positioned, a nonsterile tourniquet was placed, and the lower extremity was prepped and draped in the normal sterile surgical fashion.  A time-out was called verifying side and site of surgery. The patient received IV antibiotics within 60 minutes of beginning the procedure. The tourniquet was not utilized.   Previous scar was sharply excised the incision was carried proximally and distally in a curvilinear fashion. An anterior approach to the knee  was performed utilizing a midvastus arthrotomy. A medial release was performed and the patellar fat pad was excised. An osteotome was used to remove bony overgrowth, and the trochlear implant was removed with pliers. There was no significant bone loss. Stryker imageless navigation was used to cut the distal femur perpendicular to the mechanical axis. The patella had eburnated bone. The implant was well fixed. A freehand patellar resection was performed, and the button was removed. The patella was sized and prepared with 3 lug holes.  Nagivation was used to make a neutral proximal tibia resection, taking 9 mm of bone from the less affected lateral side with 3 degrees of slope. The menisci were excised. A spacer block was placed, and the alignment and balance in extension were confirmed.   The distal femur was sized using the 3-degree external rotation guide referencing the posterior femoral cortex. The appropriate 4-in-1 cutting block was pinned into place. Rotation was checked using Whiteside's line, the epicondylar axis, and then confirmed with a spacer block in flexion. The remaining femoral cuts were performed, taking care to protect the MCL.  The tibia was sized and the trial tray was pinned into place. The remaining trail components were inserted. The knee was stable to varus and valgus stress through a full range of motion. The patella tracked centrally, and the PCL was well balanced. The trial components were removed, and the proximal tibial surface was prepared. Final components were impacted into place. The knee was tested for a final time and found to be well balanced.   The wound was copiously irrigated with Irrisept solution and normal saline using pule lavage.  Marcaine solution was injected into the periarticular soft tissue.  The wound was closed in layers using #1 Vicryl and Stratafix for the fascia, 2-0 Vicryl for the subcutaneous fat, 2-0 Monocryl for the deep dermal  layer, 3-0 running  Monocryl subcuticular Stitch, and 4-0 Monocryl stay sutures at both ends of the wound. Dermabond was applied to the skin.  Once the glue was fully dried, an Aquacell Ag and compressive dressing were applied.  Tthe patient was transported to the recovery room in stable condition.  Sponge, needle, and instrument counts were correct at the end of the case x2.  The patient tolerated the procedure well and there were no known complications.  Please note that a surgical assistant was a medical necessity for this procedure in order to perform it in a safe and expeditious manner. Surgical assistant was necessary to retract the ligaments and vital neurovascular structures to prevent injury to them and also necessary for proper positioning of the limb to allow for anatomic placement of the prosthesis.

## 2021-04-09 ENCOUNTER — Encounter (HOSPITAL_COMMUNITY): Payer: Self-pay | Admitting: Orthopedic Surgery

## 2021-05-13 IMAGING — DX DG CHEST 1V PORT
1 series · 1 of 1 positions shown · non-contrast
Comparison: None.

CLINICAL DATA: Chest pain

EXAM:
PORTABLE CHEST 1 VIEW

[chest ap]
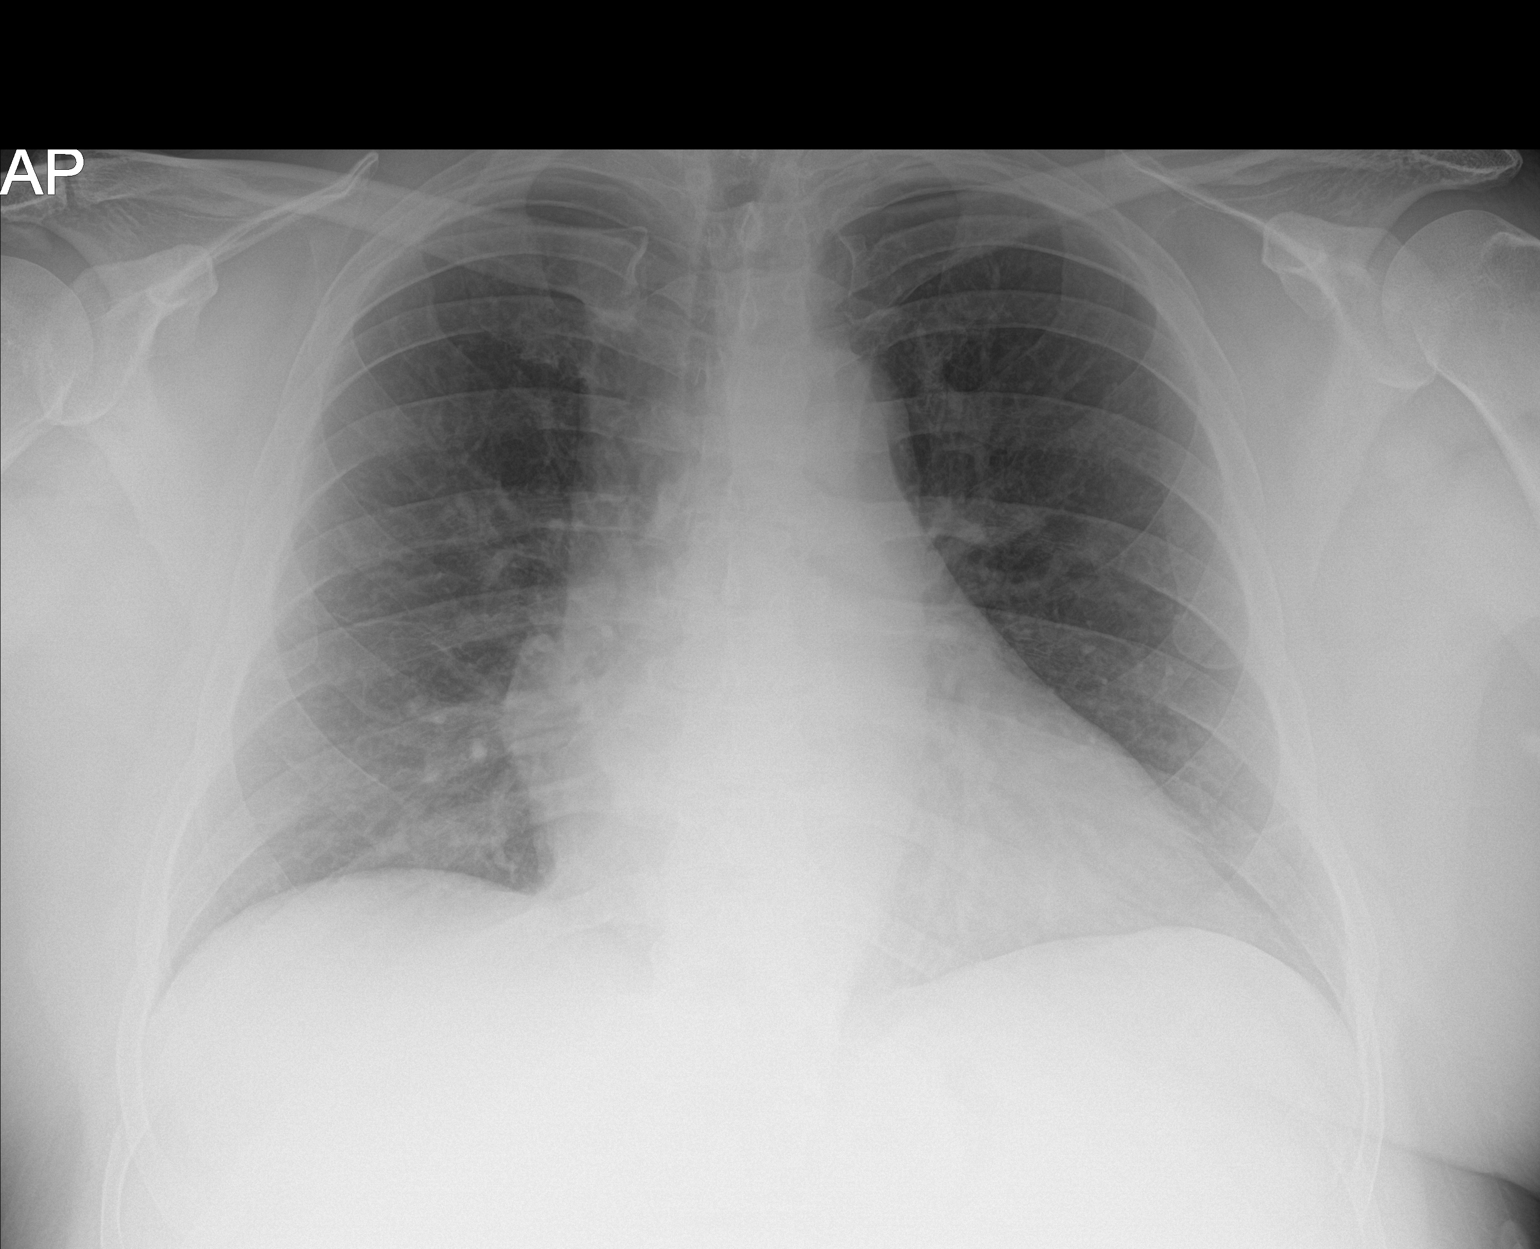

[1 of 1 positions shown; findings below may reference images not displayed]

FINDINGS: No focal airspace disease or effusion. Borderline to mild
cardiomegaly. No pneumothorax.
IMPRESSION: No active disease.  Borderline to mild cardiomegaly

## 2021-05-21 IMAGING — CT CT HEART MORP W/ CTA COR W/ SCORE W/ CA W/CM &/OR W/O CM
4 of 7 series · 8 of 20 positions shown, 9 images · IV contrast (APPLIED)
Comparison: Chest radiograph 04/16/2019
COMPARISON: Chest radiograph 04/16/2019
COMPARISON: Chest radiograph 04/16/2019
COMPARISON: Chest radiograph 04/16/2019

Addendum:
EXAM:
OVER-READ INTERPRETATION  CT CHEST

The following report is an over-read performed by radiologist Dr.
Keriadylan Elias Ortiz [[email or web address]] on 04/24/2019. This over-read
does not include interpretation of cardiac or coronary anatomy or
pathology. The coronary CTA interpretation by the cardiologist is
attached.
CLINICAL DATA: Chest pain
Cardiac/Coronary CTA
TECHNIQUE: The patient was scanned on a [employer] Force scanner. A 100 kV
prospective scan was triggered in the descending thoracic aorta at
111 HU's. Axial non-contrast 3 mm slices were carried out through
the heart. The data set was analyzed on a dedicated work station and
scored using the Agatson method. Gantry rotation speed was 250 msecs
and collimation was .6 mm. No beta blockade and 0.8 mg of sl NTG was
given. The 3D data set was reconstructed in 5% intervals of the
35-75 % of the R-R cycle. Diastolic phases were analyzed on a
dedicated work station using MPR, MIP and VRT modes. The patient
received 80 cc of contrast.
CLINICAL DATA: Atypical chest pain, risk factors, palpitations.

[Series 6: best diast 70 % · axial · 0.39mm/px · z∈[+1112,+1151]mm · 2 of 298 slices shown, 3 images]
[im 100/298  vessel]
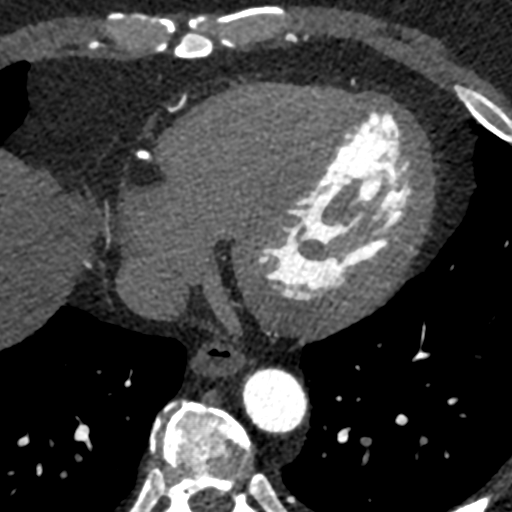
[im 100/298  lung]
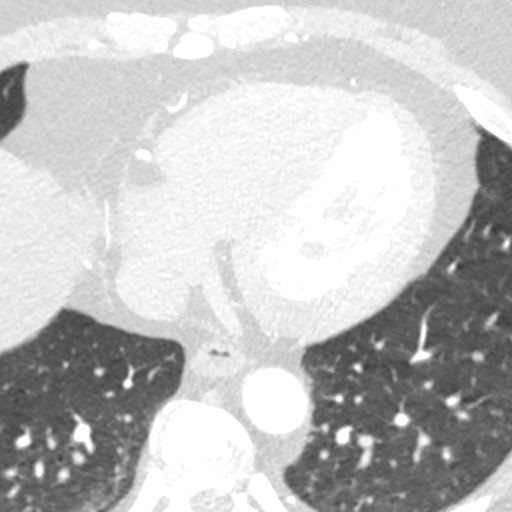
[im 199/298  vessel]
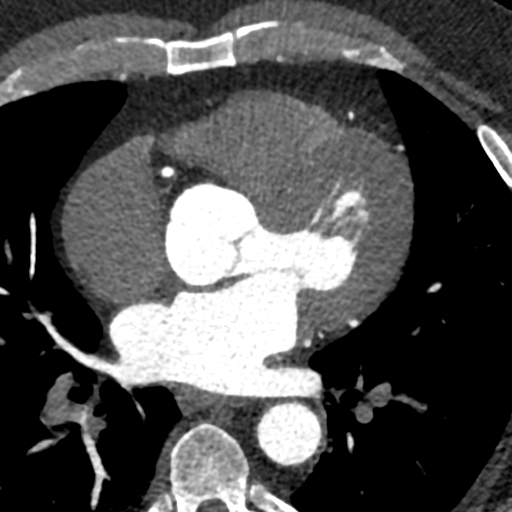

[Series 7: best syst 32 % · axial · 0.39mm/px · z∈[+1112,+1151]mm · 2 of 298 slices shown]
[im 100/298  vessel]
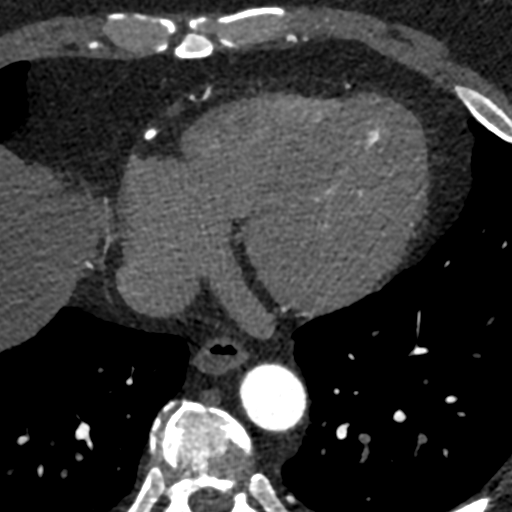
[im 199/298  vessel]
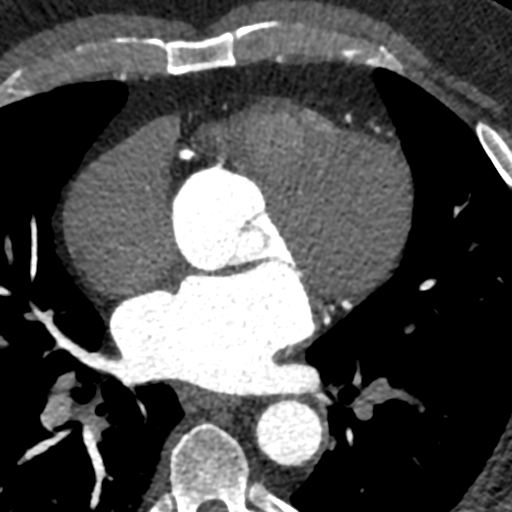

[Series 8: ts diast sharp 32 % · axial · 0.39mm/px · z∈[+1112,+1151]mm · 2 of 298 slices shown]
[im 100/298  lung]
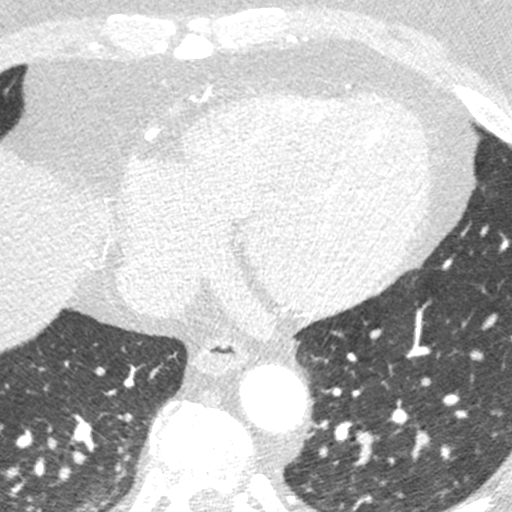
[im 199/298  lung]
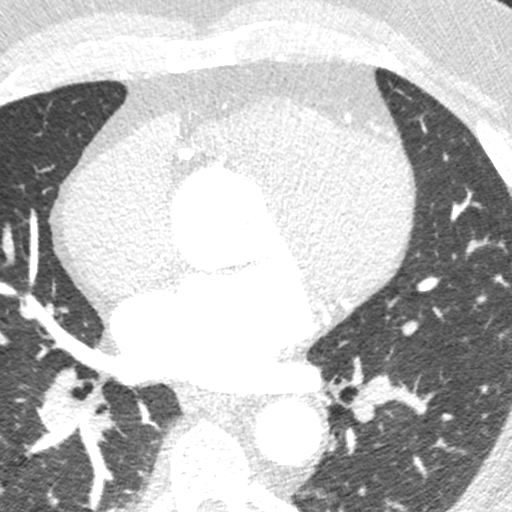

[Series 9: ts syst sharp 32 % · axial · 0.39mm/px · z∈[+1112,+1151]mm · 2 of 298 slices shown]
[im 100/298  lung]
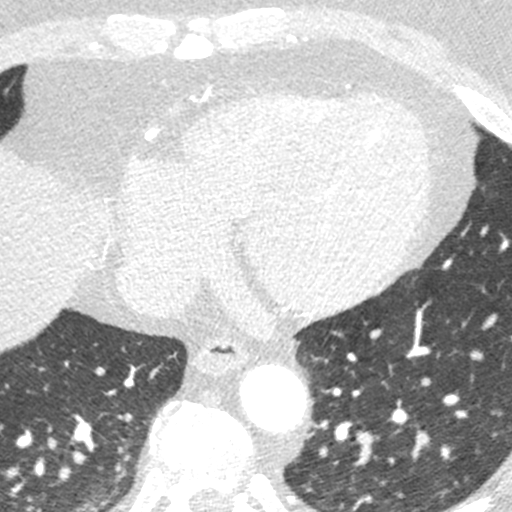
[im 199/298  lung]
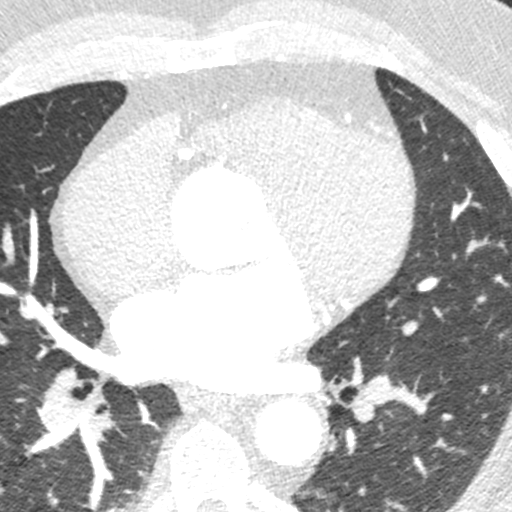

[8 of 20 positions shown; findings below may reference images not displayed]

FINDINGS: Vascular: Aortic atherosclerosis. No dissection. No central
pulmonary embolism, on this non-dedicated study.

Mediastinum/Nodes: No imaged thoracic adenopathy.

Fluid level in the esophagus on [DATE].

Lungs/Pleura: No imaged pleural fluid.

Clear imaged lungs.

Upper Abdomen: Hepatic steatosis. 2.3 cm left hepatic lobe cyst.

Musculoskeletal: No acute osseous abnormality.
IMPRESSION: 1.  No acute findings in the imaged extracardiac chest.
2. Esophageal air fluid level suggests dysmotility or
gastroesophageal reflux.
3. Hepatic steatosis.
4. Aortic Atherosclerosis (SJBWF-0M2.2).
FINDINGS: Image quality: excellent.

Noise artifact is: Limited.

Coronary Arteries:  Normal coronary origin.  Right dominance.

Left main: The left main is a large caliber vessel with a normal
take off from the left coronary cusp that trifurcates into a LAD,
LCX, and ramus intermedius. There is no plaque or stenosis.

Left anterior descending artery: The proximal has minimal
non-calcified plaque (<25%). The mid and distal LAD segments are
patent without evidence of plaque or stenosis. The LAD gives off 2
patent diagonal branches.

Ramus intermedius: Patent with no evidence of plaque or stenosis.

Left circumflex artery: The LCX is non-dominant and patent with no
evidence of plaque or stenosis. OM1 contains minimal non-calcified
plaque (<25%). OM2 and OM3 are patent without plaque or stenosis.

Right coronary artery: The RCA is dominant with normal take off from
the right coronary cusp. There is no evidence of plaque or stenosis.
The RCA terminates as a PDA and right posterolateral branch without
evidence of plaque or stenosis.

Right Atrium: Right atrial size is within normal limits.

Right Ventricle: The right ventricular cavity is within normal
limits.

Left Atrium: Left atrial size is normal in size with no left atrial
appendage filling defect. A small PFO is present.

Left Ventricle: The ventricular cavity size is within normal limits.
There are no stigmata of prior infarction. There is no abnormal
filling defect.

Pulmonary arteries: Normal in size without proximal filling defect.

Pulmonary veins: Normal pulmonary venous drainage.

Pericardium: Normal thickness with no significant effusion or
calcium present.

Cardiac valves: The aortic valve is trileaflet without significant
calcification. The mitral valve is normal structure without
significant calcification.

Aorta: Normal caliber with no significant disease.

Extra-cardiac findings: Hiatal hernia present. See attached
radiology report for non-cardiac structures.
IMPRESSION: 1. Coronary calcium score of 0.

2. Normal coronary origin with right dominance.

3. Minimal non-calcified plaque in the proximal LAD and LCX (<25%).

4. Small PFO noted.

RECOMMENDATIONS:
1. Minimal non-obstructive CAD (0-24%). Consider non-atherosclerotic
causes of chest pain. Consider preventive therapy and risk factor
modification.

EXAM:
CT FFR ANALYSIS
FINDINGS: FFRct analysis was performed on the original cardiac CT angiogram
dataset. Diagrammatic representation of the FFRct analysis is
provided in a separate PDF document in PACS. This dictation was
created using the PDF document and an interactive 3D model of the
results. 3D model is not available in the EMR/PACS. Normal FFR range
is >0.80.

1. Left Main:  No significant stenosis.

2. LAD: No significant stenosis in its proximal portion, mid and
distal portion can not be processed.
3. LCX: No significant stenosis. OM 2 significant stenosis - FFR
0.71.
4. RCA: No significant stenosis.
IMPRESSION: 1. CT FFR analysis showed hemodynamically significant stenosis of
small OM 2 branch.

2.  Mid and distal portion of LAD could not be processed.

ADDENDUM:
Due to technical difficulties the last addendum done on 07/10/2020 by
Dr. Deigo Duck does not belong on this report. Please
disregard.

*** End of Addendum ***
Addendum:
EXAM:
OVER-READ INTERPRETATION  CT CHEST

The following report is an over-read performed by radiologist Dr.
Keriadylan Elias Ortiz [[email or web address]] on 04/24/2019. This over-read
does not include interpretation of cardiac or coronary anatomy or
pathology. The coronary CTA interpretation by the cardiologist is
attached.
FINDINGS: Vascular: Aortic atherosclerosis. No dissection. No central
pulmonary embolism, on this non-dedicated study.

Mediastinum/Nodes: No imaged thoracic adenopathy.

Fluid level in the esophagus on [DATE].

Lungs/Pleura: No imaged pleural fluid.

Clear imaged lungs.

Upper Abdomen: Hepatic steatosis. 2.3 cm left hepatic lobe cyst.

Musculoskeletal: No acute osseous abnormality.
IMPRESSION: 1.  No acute findings in the imaged extracardiac chest.
2. Esophageal air fluid level suggests dysmotility or
gastroesophageal reflux.
3. Hepatic steatosis.
4. Aortic Atherosclerosis (SJBWF-0M2.2).
FINDINGS: Image quality: excellent.

Noise artifact is: Limited.

Coronary Arteries:  Normal coronary origin.  Right dominance.

Left main: The left main is a large caliber vessel with a normal
take off from the left coronary cusp that trifurcates into a LAD,
LCX, and ramus intermedius. There is no plaque or stenosis.

Left anterior descending artery: The proximal has minimal
non-calcified plaque (<25%). The mid and distal LAD segments are
patent without evidence of plaque or stenosis. The LAD gives off 2
patent diagonal branches.

Ramus intermedius: Patent with no evidence of plaque or stenosis.

Left circumflex artery: The LCX is non-dominant and patent with no
evidence of plaque or stenosis. OM1 contains minimal non-calcified
plaque (<25%). OM2 and OM3 are patent without plaque or stenosis.

Right coronary artery: The RCA is dominant with normal take off from
the right coronary cusp. There is no evidence of plaque or stenosis.
The RCA terminates as a PDA and right posterolateral branch without
evidence of plaque or stenosis.

Right Atrium: Right atrial size is within normal limits.

Right Ventricle: The right ventricular cavity is within normal
limits.

Left Atrium: Left atrial size is normal in size with no left atrial
appendage filling defect. A small PFO is present.

Left Ventricle: The ventricular cavity size is within normal limits.
There are no stigmata of prior infarction. There is no abnormal
filling defect.

Pulmonary arteries: Normal in size without proximal filling defect.

Pulmonary veins: Normal pulmonary venous drainage.

Pericardium: Normal thickness with no significant effusion or
calcium present.

Cardiac valves: The aortic valve is trileaflet without significant
calcification. The mitral valve is normal structure without
significant calcification.

Aorta: Normal caliber with no significant disease.

Extra-cardiac findings: Hiatal hernia present. See attached
radiology report for non-cardiac structures.
IMPRESSION: 1. Coronary calcium score of 0.

2. Normal coronary origin with right dominance.

3. Minimal non-calcified plaque in the proximal LAD and LCX (<25%).

4. Small PFO noted.

RECOMMENDATIONS:
1. Minimal non-obstructive CAD (0-24%). Consider non-atherosclerotic
causes of chest pain. Consider preventive therapy and risk factor
modification.

EXAM:
CT FFR ANALYSIS
FINDINGS: FFRct analysis was performed on the original cardiac CT angiogram
dataset. Diagrammatic representation of the FFRct analysis is
provided in a separate PDF document in PACS. This dictation was
created using the PDF document and an interactive 3D model of the
results. 3D model is not available in the EMR/PACS. Normal FFR range
is >0.80.

1. Left Main:  No significant stenosis.

2. LAD: No significant stenosis in its proximal portion, mid and
distal portion can not be processed.
3. LCX: No significant stenosis. OM 2 significant stenosis - FFR
0.71.
4. RCA: No significant stenosis.
IMPRESSION: 1. CT FFR analysis showed hemodynamically significant stenosis of
small OM 2 branch.

2.  Mid and distal portion of LAD could not be processed.

*** End of Addendum ***
Addendum:
EXAM:
OVER-READ INTERPRETATION  CT CHEST

The following report is an over-read performed by radiologist Dr.
Keriadylan Elias Ortiz [[email or web address]] on 04/24/2019. This over-read
does not include interpretation of cardiac or coronary anatomy or
pathology. The coronary CTA interpretation by the cardiologist is
attached.
FINDINGS: Vascular: Aortic atherosclerosis. No dissection. No central
pulmonary embolism, on this non-dedicated study.

Mediastinum/Nodes: No imaged thoracic adenopathy.

Fluid level in the esophagus on [DATE].

Lungs/Pleura: No imaged pleural fluid.

Clear imaged lungs.

Upper Abdomen: Hepatic steatosis. 2.3 cm left hepatic lobe cyst.

Musculoskeletal: No acute osseous abnormality.
IMPRESSION: 1.  No acute findings in the imaged extracardiac chest.
2. Esophageal air fluid level suggests dysmotility or
gastroesophageal reflux.
3. Hepatic steatosis.
4. Aortic Atherosclerosis (SJBWF-0M2.2).
FINDINGS: Image quality: excellent.

Noise artifact is: Limited.

Coronary Arteries:  Normal coronary origin.  Right dominance.

Left main: The left main is a large caliber vessel with a normal
take off from the left coronary cusp that trifurcates into a LAD,
LCX, and ramus intermedius. There is no plaque or stenosis.

Left anterior descending artery: The proximal has minimal
non-calcified plaque (<25%). The mid and distal LAD segments are
patent without evidence of plaque or stenosis. The LAD gives off 2
patent diagonal branches.

Ramus intermedius: Patent with no evidence of plaque or stenosis.

Left circumflex artery: The LCX is non-dominant and patent with no
evidence of plaque or stenosis. OM1 contains minimal non-calcified
plaque (<25%). OM2 and OM3 are patent without plaque or stenosis.

Right coronary artery: The RCA is dominant with normal take off from
the right coronary cusp. There is no evidence of plaque or stenosis.
The RCA terminates as a PDA and right posterolateral branch without
evidence of plaque or stenosis.

Right Atrium: Right atrial size is within normal limits.

Right Ventricle: The right ventricular cavity is within normal
limits.

Left Atrium: Left atrial size is normal in size with no left atrial
appendage filling defect. A small PFO is present.

Left Ventricle: The ventricular cavity size is within normal limits.
There are no stigmata of prior infarction. There is no abnormal
filling defect.

Pulmonary arteries: Normal in size without proximal filling defect.

Pulmonary veins: Normal pulmonary venous drainage.

Pericardium: Normal thickness with no significant effusion or
calcium present.

Cardiac valves: The aortic valve is trileaflet without significant
calcification. The mitral valve is normal structure without
significant calcification.

Aorta: Normal caliber with no significant disease.

Extra-cardiac findings: Hiatal hernia present. See attached
radiology report for non-cardiac structures.
IMPRESSION: 1. Coronary calcium score of 0.

2. Normal coronary origin with right dominance.

3. Minimal non-calcified plaque in the proximal LAD and LCX (<25%).

4. Small PFO noted.

RECOMMENDATIONS:
1. Minimal non-obstructive CAD (0-24%). Consider non-atherosclerotic
causes of chest pain. Consider preventive therapy and risk factor
modification.

*** End of Addendum ***
EXAM:
OVER-READ INTERPRETATION  CT CHEST

The following report is an over-read performed by radiologist Dr.
Keriadylan Elias Ortiz [REDACTED] on 04/24/2019. This over-read
does not include interpretation of cardiac or coronary anatomy or
pathology. The coronary CTA interpretation by the cardiologist is
attached.
FINDINGS: Vascular: Aortic atherosclerosis. No dissection. No central
pulmonary embolism, on this non-dedicated study.

Mediastinum/Nodes: No imaged thoracic adenopathy.

Fluid level in the esophagus on [DATE].

Lungs/Pleura: No imaged pleural fluid.

Clear imaged lungs.

Upper Abdomen: Hepatic steatosis. 2.3 cm left hepatic lobe cyst.

Musculoskeletal: No acute osseous abnormality.
IMPRESSION: 1.  No acute findings in the imaged extracardiac chest.
2. Esophageal air fluid level suggests dysmotility or
gastroesophageal reflux.
3. Hepatic steatosis.
4. Aortic Atherosclerosis (SJBWF-0M2.2).

## 2022-02-09 ENCOUNTER — Other Ambulatory Visit (HOSPITAL_BASED_OUTPATIENT_CLINIC_OR_DEPARTMENT_OTHER): Payer: Self-pay

## 2022-02-09 MED ORDER — INFLUENZA VAC SPLIT QUAD 0.5 ML IM SUSY
PREFILLED_SYRINGE | INTRAMUSCULAR | 0 refills | Status: DC
  Filled 2022-02-09: qty 0.5, 1d supply, fill #0

## 2022-02-09 MED ORDER — COVID-19 MRNA VAC-TRIS(PFIZER) 30 MCG/0.3ML IM SUSY
0.3000 mL | PREFILLED_SYRINGE | Freq: Once | INTRAMUSCULAR | 0 refills | Status: AC
  Filled 2022-02-09: qty 0.3, 1d supply, fill #0

## 2022-05-29 ENCOUNTER — Other Ambulatory Visit (HOSPITAL_BASED_OUTPATIENT_CLINIC_OR_DEPARTMENT_OTHER): Payer: Self-pay

## 2022-05-31 ENCOUNTER — Other Ambulatory Visit (HOSPITAL_BASED_OUTPATIENT_CLINIC_OR_DEPARTMENT_OTHER): Payer: Self-pay

## 2022-05-31 MED ORDER — OMEPRAZOLE 20 MG PO CPDR
20.0000 mg | DELAYED_RELEASE_CAPSULE | Freq: Every day | ORAL | 1 refills | Status: DC
  Filled 2022-08-12: qty 30, 30d supply, fill #0
  Filled 2022-09-11: qty 30, 30d supply, fill #1
  Filled 2022-09-25 – 2022-10-17 (×2): qty 30, 30d supply, fill #2

## 2022-05-31 MED ORDER — LOSARTAN POTASSIUM 50 MG PO TABS
50.0000 mg | ORAL_TABLET | Freq: Every day | ORAL | 1 refills | Status: DC
  Filled 2022-07-12: qty 30, 30d supply, fill #0
  Filled 2022-08-12: qty 30, 30d supply, fill #1
  Filled 2022-09-11: qty 30, 30d supply, fill #2

## 2022-05-31 MED ORDER — ROSUVASTATIN CALCIUM 10 MG PO TABS
10.0000 mg | ORAL_TABLET | Freq: Every day | ORAL | 1 refills | Status: DC
  Filled 2022-08-12: qty 30, 30d supply, fill #0
  Filled 2022-09-11: qty 30, 30d supply, fill #1
  Filled 2022-09-25 – 2022-10-17 (×2): qty 30, 30d supply, fill #2

## 2022-06-12 ENCOUNTER — Other Ambulatory Visit (HOSPITAL_BASED_OUTPATIENT_CLINIC_OR_DEPARTMENT_OTHER): Payer: Self-pay

## 2022-06-15 ENCOUNTER — Other Ambulatory Visit (HOSPITAL_BASED_OUTPATIENT_CLINIC_OR_DEPARTMENT_OTHER): Payer: Self-pay

## 2022-06-15 ENCOUNTER — Other Ambulatory Visit: Payer: Self-pay

## 2022-06-15 MED ORDER — TESTOSTERONE CYPIONATE 200 MG/ML IM SOLN
200.0000 mg | INTRAMUSCULAR | 0 refills | Status: DC
  Filled 2022-08-12: qty 6, fill #0
  Filled 2022-08-17: qty 2, 28d supply, fill #0
  Filled 2022-09-11: qty 2, 28d supply, fill #1
  Filled 2022-10-17: qty 2, 28d supply, fill #2

## 2022-06-15 MED ORDER — AMPHETAMINE-DEXTROAMPHETAMINE 20 MG PO TABS
20.0000 mg | ORAL_TABLET | Freq: Every day | ORAL | 0 refills | Status: DC
  Filled 2022-06-15: qty 30, 30d supply, fill #0

## 2022-06-19 ENCOUNTER — Other Ambulatory Visit (HOSPITAL_BASED_OUTPATIENT_CLINIC_OR_DEPARTMENT_OTHER): Payer: Self-pay

## 2022-06-21 ENCOUNTER — Other Ambulatory Visit (HOSPITAL_BASED_OUTPATIENT_CLINIC_OR_DEPARTMENT_OTHER): Payer: Self-pay

## 2022-07-03 ENCOUNTER — Other Ambulatory Visit (HOSPITAL_BASED_OUTPATIENT_CLINIC_OR_DEPARTMENT_OTHER): Payer: Self-pay

## 2022-07-10 ENCOUNTER — Other Ambulatory Visit (HOSPITAL_BASED_OUTPATIENT_CLINIC_OR_DEPARTMENT_OTHER): Payer: Self-pay

## 2022-07-12 ENCOUNTER — Other Ambulatory Visit (HOSPITAL_BASED_OUTPATIENT_CLINIC_OR_DEPARTMENT_OTHER): Payer: Self-pay

## 2022-07-20 ENCOUNTER — Other Ambulatory Visit (HOSPITAL_BASED_OUTPATIENT_CLINIC_OR_DEPARTMENT_OTHER): Payer: Self-pay

## 2022-07-20 MED ORDER — AMPHETAMINE-DEXTROAMPHETAMINE 20 MG PO TABS
20.0000 mg | ORAL_TABLET | Freq: Every day | ORAL | 0 refills | Status: DC
  Filled 2022-07-20: qty 30, 30d supply, fill #0

## 2022-07-20 MED ORDER — TESTOSTERONE CYPIONATE 200 MG/ML IM SOLN
200.0000 mg | INTRAMUSCULAR | 0 refills | Status: DC
  Filled 2022-07-20: qty 2, 28d supply, fill #0
  Filled 2022-08-12 – 2022-10-17 (×4): qty 2, 28d supply, fill #1
  Filled 2022-11-17: qty 2, 28d supply, fill #2
  Filled ????-??-??: fill #1

## 2022-07-22 ENCOUNTER — Other Ambulatory Visit (HOSPITAL_BASED_OUTPATIENT_CLINIC_OR_DEPARTMENT_OTHER): Payer: Self-pay

## 2022-07-30 ENCOUNTER — Other Ambulatory Visit (HOSPITAL_BASED_OUTPATIENT_CLINIC_OR_DEPARTMENT_OTHER): Payer: Self-pay

## 2022-07-30 MED ORDER — MELOXICAM 15 MG PO TABS
15.0000 mg | ORAL_TABLET | Freq: Every day | ORAL | 0 refills | Status: DC
  Filled 2022-07-30: qty 2, 2d supply, fill #0
  Filled 2022-07-30: qty 28, 28d supply, fill #0

## 2022-08-12 ENCOUNTER — Other Ambulatory Visit (HOSPITAL_BASED_OUTPATIENT_CLINIC_OR_DEPARTMENT_OTHER): Payer: Self-pay

## 2022-08-13 ENCOUNTER — Other Ambulatory Visit: Payer: Self-pay

## 2022-08-13 ENCOUNTER — Other Ambulatory Visit (HOSPITAL_BASED_OUTPATIENT_CLINIC_OR_DEPARTMENT_OTHER): Payer: Self-pay

## 2022-08-13 MED ORDER — MELOXICAM 15 MG PO TABS
15.0000 mg | ORAL_TABLET | Freq: Every day | ORAL | 0 refills | Status: DC
  Filled 2022-08-13 – 2022-08-23 (×3): qty 30, 30d supply, fill #0

## 2022-08-16 ENCOUNTER — Other Ambulatory Visit (HOSPITAL_BASED_OUTPATIENT_CLINIC_OR_DEPARTMENT_OTHER): Payer: Self-pay

## 2022-08-17 ENCOUNTER — Other Ambulatory Visit (HOSPITAL_BASED_OUTPATIENT_CLINIC_OR_DEPARTMENT_OTHER): Payer: Self-pay

## 2022-08-18 ENCOUNTER — Other Ambulatory Visit: Payer: Self-pay

## 2022-08-18 ENCOUNTER — Other Ambulatory Visit (HOSPITAL_BASED_OUTPATIENT_CLINIC_OR_DEPARTMENT_OTHER): Payer: Self-pay

## 2022-08-19 ENCOUNTER — Other Ambulatory Visit (HOSPITAL_BASED_OUTPATIENT_CLINIC_OR_DEPARTMENT_OTHER): Payer: Self-pay

## 2022-08-19 ENCOUNTER — Other Ambulatory Visit: Payer: Self-pay

## 2022-08-19 MED ORDER — AMPHETAMINE-DEXTROAMPHETAMINE 20 MG PO TABS
20.0000 mg | ORAL_TABLET | Freq: Every day | ORAL | 0 refills | Status: DC
  Filled 2022-08-19: qty 30, 30d supply, fill #0

## 2022-08-20 ENCOUNTER — Other Ambulatory Visit (HOSPITAL_BASED_OUTPATIENT_CLINIC_OR_DEPARTMENT_OTHER): Payer: Self-pay

## 2022-08-20 ENCOUNTER — Other Ambulatory Visit: Payer: Self-pay

## 2022-08-21 ENCOUNTER — Other Ambulatory Visit (HOSPITAL_BASED_OUTPATIENT_CLINIC_OR_DEPARTMENT_OTHER): Payer: Self-pay

## 2022-08-23 ENCOUNTER — Other Ambulatory Visit (HOSPITAL_BASED_OUTPATIENT_CLINIC_OR_DEPARTMENT_OTHER): Payer: Self-pay

## 2022-08-23 ENCOUNTER — Other Ambulatory Visit: Payer: Self-pay

## 2022-08-27 ENCOUNTER — Other Ambulatory Visit (HOSPITAL_BASED_OUTPATIENT_CLINIC_OR_DEPARTMENT_OTHER): Payer: Self-pay

## 2022-08-27 MED ORDER — HYDROCHLOROTHIAZIDE 25 MG PO TABS
25.0000 mg | ORAL_TABLET | Freq: Every day | ORAL | 1 refills | Status: DC
  Filled 2022-08-27 – 2022-08-28 (×2): qty 30, 30d supply, fill #0
  Filled 2022-09-25: qty 30, 30d supply, fill #1
  Filled 2022-10-26: qty 30, 30d supply, fill #2
  Filled 2022-11-17: qty 30, 30d supply, fill #3
  Filled 2022-12-15: qty 30, 30d supply, fill #4
  Filled 2023-01-10: qty 30, 30d supply, fill #5

## 2022-08-28 ENCOUNTER — Other Ambulatory Visit: Payer: Self-pay

## 2022-08-28 ENCOUNTER — Other Ambulatory Visit (HOSPITAL_BASED_OUTPATIENT_CLINIC_OR_DEPARTMENT_OTHER): Payer: Self-pay

## 2022-08-29 ENCOUNTER — Other Ambulatory Visit (HOSPITAL_BASED_OUTPATIENT_CLINIC_OR_DEPARTMENT_OTHER): Payer: Self-pay

## 2022-09-11 ENCOUNTER — Other Ambulatory Visit (HOSPITAL_BASED_OUTPATIENT_CLINIC_OR_DEPARTMENT_OTHER): Payer: Self-pay

## 2022-09-13 ENCOUNTER — Other Ambulatory Visit: Payer: Self-pay

## 2022-09-22 ENCOUNTER — Other Ambulatory Visit (HOSPITAL_BASED_OUTPATIENT_CLINIC_OR_DEPARTMENT_OTHER): Payer: Self-pay

## 2022-09-22 MED ORDER — MELOXICAM 15 MG PO TABS
ORAL_TABLET | ORAL | 0 refills | Status: DC
  Filled 2022-09-22: qty 30, 30d supply, fill #0

## 2022-09-25 ENCOUNTER — Other Ambulatory Visit (HOSPITAL_BASED_OUTPATIENT_CLINIC_OR_DEPARTMENT_OTHER): Payer: Self-pay

## 2022-09-26 ENCOUNTER — Other Ambulatory Visit (HOSPITAL_BASED_OUTPATIENT_CLINIC_OR_DEPARTMENT_OTHER): Payer: Self-pay

## 2022-09-27 ENCOUNTER — Other Ambulatory Visit: Payer: Self-pay

## 2022-09-30 ENCOUNTER — Other Ambulatory Visit (HOSPITAL_BASED_OUTPATIENT_CLINIC_OR_DEPARTMENT_OTHER): Payer: Self-pay

## 2022-09-30 ENCOUNTER — Encounter (HOSPITAL_BASED_OUTPATIENT_CLINIC_OR_DEPARTMENT_OTHER): Payer: Self-pay | Admitting: Pharmacist

## 2022-09-30 MED ORDER — AMPHETAMINE-DEXTROAMPHETAMINE 20 MG PO TABS
20.0000 mg | ORAL_TABLET | Freq: Every day | ORAL | 0 refills | Status: DC
  Filled 2022-09-30: qty 90, 90d supply, fill #0

## 2022-10-01 ENCOUNTER — Other Ambulatory Visit (HOSPITAL_BASED_OUTPATIENT_CLINIC_OR_DEPARTMENT_OTHER): Payer: Self-pay

## 2022-10-02 ENCOUNTER — Other Ambulatory Visit (HOSPITAL_BASED_OUTPATIENT_CLINIC_OR_DEPARTMENT_OTHER): Payer: Self-pay

## 2022-10-02 ENCOUNTER — Other Ambulatory Visit (HOSPITAL_COMMUNITY): Payer: Self-pay

## 2022-10-04 ENCOUNTER — Other Ambulatory Visit (HOSPITAL_BASED_OUTPATIENT_CLINIC_OR_DEPARTMENT_OTHER): Payer: Self-pay

## 2022-10-17 ENCOUNTER — Other Ambulatory Visit (HOSPITAL_BASED_OUTPATIENT_CLINIC_OR_DEPARTMENT_OTHER): Payer: Self-pay

## 2022-10-18 ENCOUNTER — Other Ambulatory Visit: Payer: Self-pay

## 2022-10-21 ENCOUNTER — Encounter (HOSPITAL_BASED_OUTPATIENT_CLINIC_OR_DEPARTMENT_OTHER): Payer: Self-pay

## 2022-10-22 ENCOUNTER — Other Ambulatory Visit (HOSPITAL_BASED_OUTPATIENT_CLINIC_OR_DEPARTMENT_OTHER): Payer: Self-pay

## 2022-10-22 MED ORDER — LOSARTAN POTASSIUM 50 MG PO TABS
50.0000 mg | ORAL_TABLET | Freq: Every day | ORAL | 1 refills | Status: DC
  Filled 2022-10-22 (×2): qty 30, 30d supply, fill #0
  Filled 2022-11-17: qty 30, 30d supply, fill #1
  Filled 2022-12-15: qty 30, 30d supply, fill #2
  Filled 2023-01-10: qty 30, 30d supply, fill #3
  Filled 2023-02-07: qty 30, 30d supply, fill #4

## 2022-10-26 ENCOUNTER — Other Ambulatory Visit (HOSPITAL_BASED_OUTPATIENT_CLINIC_OR_DEPARTMENT_OTHER): Payer: Self-pay

## 2022-10-27 ENCOUNTER — Other Ambulatory Visit (HOSPITAL_BASED_OUTPATIENT_CLINIC_OR_DEPARTMENT_OTHER): Payer: Self-pay

## 2022-10-27 ENCOUNTER — Other Ambulatory Visit: Payer: Self-pay

## 2022-10-27 MED ORDER — MELOXICAM 15 MG PO TABS
15.0000 mg | ORAL_TABLET | Freq: Every day | ORAL | 0 refills | Status: DC
  Filled 2022-10-27: qty 30, 30d supply, fill #0

## 2022-11-17 ENCOUNTER — Other Ambulatory Visit (HOSPITAL_BASED_OUTPATIENT_CLINIC_OR_DEPARTMENT_OTHER): Payer: Self-pay

## 2022-11-18 ENCOUNTER — Other Ambulatory Visit: Payer: Self-pay

## 2022-11-19 ENCOUNTER — Other Ambulatory Visit (HOSPITAL_BASED_OUTPATIENT_CLINIC_OR_DEPARTMENT_OTHER): Payer: Self-pay

## 2022-11-19 MED ORDER — MELOXICAM 15 MG PO TABS
15.0000 mg | ORAL_TABLET | Freq: Every day | ORAL | 0 refills | Status: DC
  Filled 2022-11-19: qty 30, 30d supply, fill #0

## 2022-11-23 ENCOUNTER — Other Ambulatory Visit (HOSPITAL_BASED_OUTPATIENT_CLINIC_OR_DEPARTMENT_OTHER): Payer: Self-pay

## 2022-11-25 ENCOUNTER — Encounter (HOSPITAL_BASED_OUTPATIENT_CLINIC_OR_DEPARTMENT_OTHER): Payer: Self-pay

## 2022-11-25 ENCOUNTER — Other Ambulatory Visit (HOSPITAL_BASED_OUTPATIENT_CLINIC_OR_DEPARTMENT_OTHER): Payer: Self-pay

## 2022-11-26 ENCOUNTER — Other Ambulatory Visit: Payer: Self-pay

## 2022-11-26 ENCOUNTER — Other Ambulatory Visit (HOSPITAL_BASED_OUTPATIENT_CLINIC_OR_DEPARTMENT_OTHER): Payer: Self-pay

## 2022-11-26 MED ORDER — OMEPRAZOLE 20 MG PO CPDR
20.0000 mg | DELAYED_RELEASE_CAPSULE | Freq: Every day | ORAL | 1 refills | Status: DC
  Filled 2022-11-26: qty 30, 30d supply, fill #0
  Filled 2022-12-15 – 2022-12-20 (×2): qty 30, 30d supply, fill #1
  Filled 2023-01-10 – 2023-01-12 (×3): qty 30, 30d supply, fill #2
  Filled 2023-02-07: qty 30, 30d supply, fill #3

## 2022-11-26 MED ORDER — ROSUVASTATIN CALCIUM 10 MG PO TABS
10.0000 mg | ORAL_TABLET | Freq: Every day | ORAL | 1 refills | Status: DC
  Filled 2022-11-26: qty 30, 30d supply, fill #0
  Filled 2022-12-15 – 2022-12-20 (×2): qty 30, 30d supply, fill #1
  Filled 2023-01-10 – 2023-01-12 (×3): qty 30, 30d supply, fill #2
  Filled 2023-02-07: qty 30, 30d supply, fill #3

## 2022-12-15 ENCOUNTER — Other Ambulatory Visit: Payer: Self-pay

## 2022-12-15 ENCOUNTER — Other Ambulatory Visit (HOSPITAL_BASED_OUTPATIENT_CLINIC_OR_DEPARTMENT_OTHER): Payer: Self-pay

## 2023-01-08 ENCOUNTER — Other Ambulatory Visit (HOSPITAL_BASED_OUTPATIENT_CLINIC_OR_DEPARTMENT_OTHER): Payer: Self-pay

## 2023-01-08 MED ORDER — INFLUENZA VIRUS VACC SPLIT PF (FLUZONE) 0.5 ML IM SUSY
0.5000 mL | PREFILLED_SYRINGE | Freq: Once | INTRAMUSCULAR | 0 refills | Status: AC
  Filled 2023-01-08: qty 0.5, 1d supply, fill #0

## 2023-01-08 MED ORDER — COVID-19 MRNA VAC-TRIS(PFIZER) 30 MCG/0.3ML IM SUSY
0.3000 mL | PREFILLED_SYRINGE | Freq: Once | INTRAMUSCULAR | 0 refills | Status: AC
  Filled 2023-01-08: qty 0.3, 1d supply, fill #0

## 2023-01-10 ENCOUNTER — Other Ambulatory Visit: Payer: Self-pay

## 2023-01-10 ENCOUNTER — Other Ambulatory Visit (HOSPITAL_BASED_OUTPATIENT_CLINIC_OR_DEPARTMENT_OTHER): Payer: Self-pay

## 2023-01-12 ENCOUNTER — Other Ambulatory Visit: Payer: Self-pay

## 2023-01-13 ENCOUNTER — Other Ambulatory Visit (HOSPITAL_BASED_OUTPATIENT_CLINIC_OR_DEPARTMENT_OTHER): Payer: Self-pay

## 2023-01-19 ENCOUNTER — Other Ambulatory Visit (HOSPITAL_BASED_OUTPATIENT_CLINIC_OR_DEPARTMENT_OTHER): Payer: Self-pay

## 2023-02-07 ENCOUNTER — Other Ambulatory Visit: Payer: Self-pay

## 2023-02-07 ENCOUNTER — Other Ambulatory Visit (HOSPITAL_BASED_OUTPATIENT_CLINIC_OR_DEPARTMENT_OTHER): Payer: Self-pay

## 2023-02-10 ENCOUNTER — Encounter: Payer: Self-pay | Admitting: Internal Medicine

## 2023-02-10 ENCOUNTER — Other Ambulatory Visit (HOSPITAL_BASED_OUTPATIENT_CLINIC_OR_DEPARTMENT_OTHER): Payer: Self-pay

## 2023-02-10 ENCOUNTER — Encounter (HOSPITAL_BASED_OUTPATIENT_CLINIC_OR_DEPARTMENT_OTHER): Payer: Self-pay

## 2023-02-10 ENCOUNTER — Ambulatory Visit (INDEPENDENT_AMBULATORY_CARE_PROVIDER_SITE_OTHER): Payer: 59 | Admitting: Internal Medicine

## 2023-02-10 VITALS — BP 136/72 | HR 69 | Temp 98.0°F | Ht 68.5 in | Wt 223.8 lb

## 2023-02-10 DIAGNOSIS — F410 Panic disorder [episodic paroxysmal anxiety] without agoraphobia: Secondary | ICD-10-CM | POA: Diagnosis not present

## 2023-02-10 DIAGNOSIS — M1711 Unilateral primary osteoarthritis, right knee: Secondary | ICD-10-CM

## 2023-02-10 DIAGNOSIS — M1990 Unspecified osteoarthritis, unspecified site: Secondary | ICD-10-CM | POA: Diagnosis not present

## 2023-02-10 DIAGNOSIS — Z79899 Other long term (current) drug therapy: Secondary | ICD-10-CM | POA: Insufficient documentation

## 2023-02-10 DIAGNOSIS — K2100 Gastro-esophageal reflux disease with esophagitis, without bleeding: Secondary | ICD-10-CM | POA: Diagnosis not present

## 2023-02-10 DIAGNOSIS — T7840XD Allergy, unspecified, subsequent encounter: Secondary | ICD-10-CM

## 2023-02-10 DIAGNOSIS — F988 Other specified behavioral and emotional disorders with onset usually occurring in childhood and adolescence: Secondary | ICD-10-CM

## 2023-02-10 DIAGNOSIS — E785 Hyperlipidemia, unspecified: Secondary | ICD-10-CM | POA: Insufficient documentation

## 2023-02-10 DIAGNOSIS — E291 Testicular hypofunction: Secondary | ICD-10-CM

## 2023-02-10 DIAGNOSIS — I152 Hypertension secondary to endocrine disorders: Secondary | ICD-10-CM

## 2023-02-10 DIAGNOSIS — E8881 Metabolic syndrome: Secondary | ICD-10-CM

## 2023-02-10 DIAGNOSIS — T7840XA Allergy, unspecified, initial encounter: Secondary | ICD-10-CM | POA: Insufficient documentation

## 2023-02-10 MED ORDER — AMPHETAMINE-DEXTROAMPHETAMINE 20 MG PO TABS
20.0000 mg | ORAL_TABLET | Freq: Every day | ORAL | 0 refills | Status: DC | PRN
  Filled 2023-02-10 (×2): qty 90, 90d supply, fill #0

## 2023-02-10 MED ORDER — NAPROXEN SODIUM 220 MG PO TABS
220.0000 mg | ORAL_TABLET | Freq: Every day | ORAL | 3 refills | Status: DC | PRN
  Filled 2023-02-10: qty 50, 50d supply, fill #0
  Filled 2023-03-11: qty 50, 50d supply, fill #1
  Filled 2023-04-08: qty 50, 50d supply, fill #2
  Filled 2023-04-27 – 2023-04-30 (×2): qty 50, 50d supply, fill #3

## 2023-02-10 MED ORDER — OMEPRAZOLE 20 MG PO CPDR
20.0000 mg | DELAYED_RELEASE_CAPSULE | Freq: Every day | ORAL | 1 refills | Status: DC
  Filled 2023-02-10: qty 90, 90d supply, fill #0
  Filled 2023-03-11: qty 30, 30d supply, fill #0
  Filled 2023-04-08: qty 30, 30d supply, fill #1
  Filled 2023-04-27: qty 30, 30d supply, fill #2
  Filled 2023-05-20 – 2023-05-23 (×2): qty 30, 30d supply, fill #3
  Filled 2023-05-23: qty 90, 90d supply, fill #3

## 2023-02-10 MED ORDER — ROSUVASTATIN CALCIUM 10 MG PO TABS
10.0000 mg | ORAL_TABLET | Freq: Every day | ORAL | 1 refills | Status: DC
  Filled 2023-02-10: qty 90, 90d supply, fill #0
  Filled 2023-03-11: qty 30, 30d supply, fill #0
  Filled 2023-04-08: qty 30, 30d supply, fill #1
  Filled 2023-04-27: qty 30, 30d supply, fill #2
  Filled 2023-05-20 – 2023-05-23 (×2): qty 30, 30d supply, fill #3
  Filled 2023-05-23: qty 90, 90d supply, fill #3

## 2023-02-10 MED ORDER — BD LUER-LOK SYRINGE 18G X 1-1/2" 3 ML MISC
1.0000 | 1 refills | Status: DC
  Filled 2023-02-10: qty 2, 28d supply, fill #0
  Filled 2023-03-11: qty 2, 28d supply, fill #1
  Filled 2023-04-08: qty 2, 28d supply, fill #2
  Filled 2023-04-27 – 2023-04-30 (×2): qty 2, 28d supply, fill #3
  Filled 2023-07-28: qty 2, 28d supply, fill #4

## 2023-02-10 MED ORDER — TESTOSTERONE CYPIONATE 200 MG/ML IM SOLN
200.0000 mg | INTRAMUSCULAR | 1 refills | Status: DC
  Filled 2023-02-10: qty 2, 28d supply, fill #0
  Filled 2023-03-11: qty 2, 28d supply, fill #1
  Filled 2023-04-08: qty 2, 28d supply, fill #2
  Filled 2023-04-23 – 2023-04-27 (×2): qty 2, 28d supply, fill #3
  Filled 2023-04-30 – 2023-05-06 (×2): qty 6, 84d supply, fill #3
  Filled 2023-07-28: qty 6, 84d supply, fill #4

## 2023-02-10 MED ORDER — LOSARTAN POTASSIUM 50 MG PO TABS
50.0000 mg | ORAL_TABLET | Freq: Every day | ORAL | 1 refills | Status: DC
  Filled 2023-02-10: qty 90, 90d supply, fill #0
  Filled 2023-03-11: qty 30, 30d supply, fill #0
  Filled 2023-04-08: qty 30, 30d supply, fill #1
  Filled 2023-04-27: qty 30, 30d supply, fill #2
  Filled 2023-05-20 – 2023-05-23 (×2): qty 30, 30d supply, fill #3
  Filled 2023-05-23: qty 90, 90d supply, fill #3

## 2023-02-10 MED ORDER — ACETAMINOPHEN 500 MG PO TABS
1000.0000 mg | ORAL_TABLET | Freq: Three times a day (TID) | ORAL | Status: AC | PRN
Start: 2023-02-10 — End: ?

## 2023-02-10 MED ORDER — BD DISP NEEDLES 22G X 1-1/2" MISC
1.0000 | 3 refills | Status: DC
  Filled 2023-02-10: qty 2, 28d supply, fill #0
  Filled 2023-03-11: qty 2, 28d supply, fill #1
  Filled 2023-04-08: qty 2, 28d supply, fill #2
  Filled 2023-04-27 – 2023-04-30 (×2): qty 2, 28d supply, fill #3
  Filled 2023-07-28: qty 2, 28d supply, fill #4

## 2023-02-10 MED ORDER — HYDROCHLOROTHIAZIDE 25 MG PO TABS
25.0000 mg | ORAL_TABLET | Freq: Every day | ORAL | 3 refills | Status: DC
  Filled 2023-02-10: qty 30, 30d supply, fill #0
  Filled 2023-03-11: qty 30, 30d supply, fill #1
  Filled 2023-04-08: qty 30, 30d supply, fill #2
  Filled 2023-04-27: qty 30, 30d supply, fill #3
  Filled 2023-05-20 – 2023-05-23 (×2): qty 30, 30d supply, fill #4
  Filled 2023-05-23: qty 90, 90d supply, fill #4

## 2023-02-10 NOTE — Progress Notes (Signed)
[facility]: [phone]   -- Medical Office Visit --  Patient:  Theodore Munoz      Age: 57 y.o.       Sex:  male  Date:   02/10/2023 Today's Healthcare Provider: Lula Olszewski, MD  ==========================================================  For this establis reason h care visit I just focused on getting a comprehensive listing of his medical problems getting his medications renewed and setting him up for 29-month follow-up while we can get lab work as part of a annual wellness visit hopefully more affordable.  We also went ahead and got him on a controlled substance contract signed and scanned in for future management of controlled substances and all primary medical care  Assessment Plan        Panic disorder without agoraphobia  Allergy, subsequent encounter  Arthritis -     Acetaminophen; Take 2 tablets (1,000 mg total) by mouth every 8 (eight) hours as needed for mild pain (pain score 1-3), fever or moderate pain (pain score 4-6).  Gastroesophageal reflux disease with esophagitis without hemorrhage -     Omeprazole; Take 1 capsule (20 mg total) by mouth daily.  Dispense: 90 capsule; Refill: 1  Hypertension due to endocrine disorder -     hydroCHLOROthiazide; Take 1 tablet (25 mg total) by mouth daily.  Dispense: 90 tablet; Refill: 3 -     Losartan Potassium; Take 1 tablet (50 mg total) by mouth daily.  Dispense: 90 tablet; Refill: 1 -     Naproxen Sodium; Take 1 tablet (220 mg total) by mouth daily as needed.  Dispense: 90 tablet; Refill: 3  Metabolic syndrome  Hyperlipidemia, unspecified hyperlipidemia type -     Rosuvastatin Calcium; Take 1 tablet (10 mg total) by mouth daily.  Dispense: 90 tablet; Refill: 1  Primary osteoarthritis of right knee  ADD (attention deficit disorder) without hyperactivity -     Amphetamine-Dextroamphetamine; Take 1 tablet (20 mg total) by mouth daily as needed (work).  Dispense: 90 tablet; Refill: 0 -     Drug  Screen, 5 Panel, Ur  Hypogonadism in male -     BD Disp Needles; Inject 1 each into the muscle every 14 (fourteen) days.  Dispense: 50 each; Refill: 3 -     BD Luer-Lok Syringe; Inject 1 each into the muscle every 14 (fourteen) days.  Dispense: 50 each; Refill: 1 -     Testosterone Cypionate; Inject 1 mL (200 mg total) into the muscle every 14 (fourteen) days.  Dispense: 10 mL; Refill: 1  High risk medication use Assessment & Plan: Assessment: Risks and benefits were weighed and continued maintenance of the controlled substance prescription will be provided.   Continued education about risks and benefits and safe use was also provided.  Importance of securing medications has been reviewed.  Relevant comorbid conditions include metabolic syndrome with stimulants and testosterone.  These factors are taken into account in the overall treatment plan to minimize potential interactions or complications.    PDMP reviewed during this encounter.  \Urine Drug Screening ordered controlled substance contract obtained, 02/10/23        Recommended follow-up: No follow-ups on file. Future Appointments  Date Time Provider Department Center  05/18/2023  8:00 AM Lula Olszewski, MD [facility]  Patient Care Team: Lula Olszewski, MD as PCP - General (Internal Medicine) O'Neal, Ronnald Ramp, MD as PCP - Cardiology (Cardiology)   Subjective   57 y.o. male who has Osteoarthritis of right knee; Gastroesophageal reflux  disease with esophagitis without hemorrhage; Arthritis; Panic disorder without agoraphobia; Allergies; Hyperlipidemia; Metabolic syndrome; Hypertension due to endocrine disorder; ADD (attention deficit disorder) without hyperactivity; High risk medication use; and Hypogonadism in male on their problem list.  Main reasons for visit/main concerns/chief complaint:  -------------------------------------------------------------------------- This is a pleasant gentleman who presents due to  needing primary care and was following with [facility] but wanted to change over due to dissatisfaction with extensive testing that was being required.  He takes ADHD medication long-term and testosterone long-term and would like those to be managed here as well as the rest of his primary medical care.  He would like refills placed on all of his chronic medications.  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     Problem list overviews that were updated at today's visit: Problem  Gastroesophageal Reflux Disease With Esophagitis Without Hemorrhage   Takes proton pump inhibitor (PPI) stomach acid reducer due to esophagoduodenoscopy found esophagitis, although he never felt it.    Arthritis  Panic Disorder Without Agoraphobia  Allergies  Hyperlipidemia   Takes low dose medication(s) due to slightly high for risk factors    Metabolic Syndrome  Hypertension Due to Endocrine Disorder  Add (Attention Deficit Disorder) Without Hyperactivity  High Risk Medication Use  Hypogonadism in Male  Osteoarthritis of Right Knee   R total knee after failed hardware Left partial knee still in place   Failed Orthopedic Implant (Hcc) (Resolved)  Plantar Fasciitis (Resolved)    Med reconciliation: No current outpatient medications on file prior to visit.   No current facility-administered medications on file prior to visit.   Medications Discontinued During This Encounter  Medication Reason   testosterone cypionate  (DEPOTESTOSTERONE CYPIONATE) 200 MG/ML injection    testosterone cypionate (DEPOTESTOSTERONE CYPIONATE) 200 MG/ML injection    amphetamine-dextroamphetamine (ADDERALL) 20 MG tablet    hydrochlorothiazide (HYDRODIURIL) 25 MG tablet    influenza vac split quadrivalent PF (FLUARIX) 0.5 ML injection    meloxicam (MOBIC) 15 MG tablet    rosuvastatin (CRESTOR) 10 MG tablet    losartan (COZAAR) 25 MG tablet    omeprazole (PRILOSEC) 20 MG capsule    ondansetron (ZOFRAN) 4 MG tablet    meloxicam (MOBIC) 15 MG tablet    oxyCODONE (ROXICODONE) 5 MG immediate release tablet    hydrochlorothiazide (HYDRODIURIL) 25 MG tablet Reorder   testosterone cypionate (DEPOTESTOSTERONE CYPIONATE) 200 MG/ML injection Reorder   amphetamine-dextroamphetamine (ADDERALL) 20 MG tablet Reorder   acetaminophen (TYLENOL) 500 MG tablet Reorder   losartan (COZAAR) 50 MG tablet Reorder   omeprazole (PRILOSEC) 20 MG capsule Reorder   rosuvastatin (CRESTOR) 10 MG tablet Reorder   naproxen sodium (ALEVE) 220 MG tablet Reorder   NEEDLE, DISP, 22 G (BD DISP NEEDLES) 22G X 1-1/2" MISC Reorder   SYRINGE-NEEDLE, DISP, 3 ML (B-D 3CC LUER-LOK SYR 18GX1-1/2) 18G X 1-1/2" 3 ML MISC Reorder     Objective   Physical Exam  BP 136/72   Pulse 69   Temp 98 F (36.7 C)   Ht 5' 8.5" (1.74 m)  Wt 223 lb 12.8 oz (101.5 kg)   SpO2 97%   BMI 33.53 kg/m  Wt Readings from Last 10 Encounters:  02/10/23 223 lb 12.8 oz (101.5 kg)  03/27/21 184 lb (83.5 kg)  05/21/19 228 lb (103.4 kg)  05/01/19 235 lb (106.6 kg)  04/17/19 244 lb (110.7 kg)  04/15/19 246 lb 14.4 oz (112 kg)  04/15/19 240 lb (108.9 kg)  12/15/17 244 lb (110.7 kg)  12/01/17 244 lb 6.4 oz (110.9 kg)  06/30/17 241 lb 8 oz (109.5 kg)   Vital signs reviewed.  Nursing notes reviewed. Weight trend reviewed. Abnormalities and Problem-Specific physical exam findings:  truncal adiposity   General Appearance:  No acute distress appreciable.   Well-groomed, healthy-appearing  male.  Well proportioned with no abnormal fat distribution.  Good muscle tone. Pulmonary:  Normal work of breathing at rest, no respiratory distress apparent. SpO2: 97 %  Musculoskeletal: All extremities are intact.  Neurological:  Awake, alert, oriented, and engaged.  No obvious focal neurological deficits or cognitive impairments.  Sensorium seems unclouded.   Speech is clear and coherent with logical content. Psychiatric:  Appropriate mood, pleasant and cooperative demeanor, thoughtful and engaged during the exam  Results            No results found for any visits on 02/10/23.  No visits with results within 1 Year(s) from this visit.  Latest known visit with results is:  Admission on 04/08/2021, Discharged on 04/08/2021  Component Date Value   ABO/RH(D) 04/08/2021                     Value:A POS Performed at [facility], [address], [place], [place] [address]    No image results found.   No results found.  DG Knee Right Port  Result Date: 04/08/2021 CLINICAL DATA:  Status post right total knee arthroplasty EXAM: PORTABLE RIGHT KNEE - 1-2 VIEW COMPARISON:  None. FINDINGS: Status post right total knee arthroplasty with well-positioned distal femoral, proximal tibial and posterior patellar prostheses. No bone fracture. No bone lesions. No dislocation. Expected gas within and surrounding the right knee joint. IMPRESSION: Satisfactory immediate postoperative appearance status post right total knee arthroplasty. Electronically Signed   By: Delbert Phenix M.D.   On: 04/08/2021 14:37     Additional Info: This encounter employed real-time, collaborative documentation. The patient actively reviewed and updated their medical record on a shared screen, ensuring transparency and facilitating joint problem-solving for the problem list, overview, and plan. This approach promotes accurate, informed care. The treatment plan was discussed and reviewed in detail, including  medication safety, potential side effects, and all patient questions. We confirmed understanding and comfort with the plan. Follow-up instructions were established, including contacting the office for any concerns, returning if symptoms worsen, persist, or new symptoms develop, and precautions for potential emergency department visits.

## 2023-02-10 NOTE — Patient Instructions (Signed)
Welcome aboard!   Today's visit was a valuable first step in understanding your health and starting your personalized care journey. We discussed your medical history and medications in detail. Given the extensive information, we prioritized addressing your most pressing concerns.  We understood those concerns to be:  Establish Care (Anxiety/Attention Deficit Hyperactivity Disorder (ADHD) management ), ADHD, and testosterone   Building a Complete Picture  To create the most effective care plan possible, we may need additional information from previous providers. We encouraged you to gather any relevant medical records for your next visit. This will help [place] build a more complete picture and develop a personalized plan together. In the meantime, we'll address your immediate concerns and provide resources to help you manage all of your medical issues.  We encourage you to use MyChart to review these efforts, and to help [place] find and correct any omissions or errors in your medical chart.  Managing Your Health Over Time  Managing every aspect of your health in a single visit isn't always feasible, but that's okay.  We addressed your most pressing concerns today and charted a course for future care. Acute conditions or preventive care measures may require further attention.  We encourage you to schedule a follow-up visit at your earliest convenience to discuss any unresolved issues.  We strongly encourage participation in annual preventive care visits to help [place] develop a more thorough understanding of your health and to help you maintain optimal wellness - please inquire about scheduling your next one with [place] at your earliest convenience.  Your Satisfaction Matters  It was a pleasure seeing you today!  Your health and satisfaction will always be my top priorities. If you believe your experience today was worthy of a 5-star rating, I'd be grateful for your feedback!  Lula Olszewski, MD   Next  Steps  Schedule Follow-Up:  We recommend a follow-up appointment in No follow-ups on file. If your condition worsens before then, please call us or seek emergency care. Preventive Care:  Don't forget to schedule your annual preventive care visit!  This important checkup is typically covered by insurance and helps identify potential health issues early.  Typically its 100% insurance covered with no co-pay and helps to get surveillance labwork paid for through your insurance provider.  Sometimes it even lowers your insurance premiums to participate. Medical Information Release:  For any relevant medical information we don't have, please sign a release form so we can obtain it for your records. Lab & X-ray Appointments:  Scheduled any incomplete lab tests today or call us to schedule.  X-Rays can be done without an appointment at [facility] ([address]), M-F 8:30am-noon or 1pm-5pm.  Just tell them you're there for X-rays ordered by Dr. Jon Billings.  We'll receive the results and contact you by phone or MyChart to discuss next steps.  Bring to Your Next Appointment  Medications: Please bring all your medication bottles to your next appointment to ensure we have an accurate record of your prescriptions. Health Diaries: If you're monitoring any health conditions at home, keeping a diary of your readings can be very helpful for discussions at your next appointment.  Reviewing Your Records  Please Review this early draft of your clinical notes below and the final encounter summary tomorrow on MyChart after its been completed.   Panic disorder without agoraphobia  Allergy, subsequent encounter  Arthritis -     Acetaminophen; Take 2 tablets (1,000 mg total)  by mouth every 8 (eight) hours as needed for mild pain (pain score 1-3), fever or moderate pain (pain score 4-6).  Gastroesophageal reflux disease with esophagitis without hemorrhage -     Omeprazole; Take 1 capsule (20  mg total) by mouth daily.  Dispense: 90 capsule; Refill: 1  Hypertension due to endocrine disorder -     hydroCHLOROthiazide; Take 1 tablet (25 mg total) by mouth daily.  Dispense: 90 tablet; Refill: 3 -     Losartan Potassium; Take 1 tablet (50 mg total) by mouth daily.  Dispense: 90 tablet; Refill: 1 -     Naproxen Sodium; Take 1 tablet (220 mg total) by mouth daily as needed.  Dispense: 90 tablet; Refill: 3  Metabolic syndrome  Hyperlipidemia, unspecified hyperlipidemia type -     Rosuvastatin Calcium; Take 1 tablet (10 mg total) by mouth daily.  Dispense: 90 tablet; Refill: 1  Primary osteoarthritis of right knee  ADD (attention deficit disorder) without hyperactivity -     Amphetamine-Dextroamphetamine; Take 1 tablet (20 mg total) by mouth daily as needed (work).  Dispense: 90 tablet; Refill: 0 -     Drug Screen, 5 Panel, Ur  Hypogonadism in male -     BD Disp Needles; Inject 1 each into the muscle every 14 (fourteen) days.  Dispense: 50 each; Refill: 3 -     BD Luer-Lok Syringe; Inject 1 each into the muscle every 14 (fourteen) days.  Dispense: 50 each; Refill: 1 -     Testosterone Cypionate; Inject 1 mL (200 mg total) into the muscle every 14 (fourteen) days.  Dispense: 10 mL; Refill: 1  High risk medication use     Getting Answers and Following Up  Simple Questions & Concerns: For quick questions or basic follow-up after your visit, reach [place] at [phone] or MyChart messaging. Complex Concerns: If your concern is more complex, scheduling an appointment might be best. Discuss this with the staff to find the most suitable option. Lab & Imaging Results: We'll contact you directly if results are abnormal or you don't use MyChart. Most normal results will be on MyChart within 2-3 business days, with a review message from Dr. Jon Billings. Haven't heard back in 2 weeks? Need results sooner? Contact us at [phone]. Referrals: Our referral coordinator will manage specialist  referrals. The specialist's office should contact you within 2 weeks to schedule an appointment. Call us if you haven't heard from them after 2 weeks.  Staying Connected  MyChart: Activate your MyChart for the fastest way to access results and message [place]. See the last page of this paperwork for instructions.  Billing  X-ray & Lab Orders: These are billed by separate companies. Contact the invoicing company directly for questions or concerns. Visit Charges: Discuss any billing inquiries with our administrative services team.  Feedback & Satisfaction  Share Your Experience: We strive for your satisfaction! If you have any complaints, please let Dr. Jon Billings know directly or contact our Practice Administrators, Edwena Felty or [employer], by asking at the front desk.  Scheduling Tips  Shorter Wait Times: 8 am and 1 pm appointments often have the quickest wait times. Longer Appointments: If you need more time during your visit, talk to the front desk. Due to insurance regulations, multiple back-to-back appointments might be necessary.

## 2023-02-10 NOTE — Assessment & Plan Note (Signed)
Assessment: Risks and benefits were weighed and continued maintenance of the controlled substance prescription will be provided.   Continued education about risks and benefits and safe use was also provided.  Importance of securing medications has been reviewed.  Relevant comorbid conditions include metabolic syndrome with stimulants and testosterone.  These factors are taken into account in the overall treatment plan to minimize potential interactions or complications.    PDMP reviewed during this encounter.  \[place] Drug Screening ordered controlled substance contract obtained, 02/10/23

## 2023-02-15 LAB — DRUG SCREEN, 5 PANEL, UR
Amphetamines, Urine: POSITIVE — AB
Cannabinoid Quant, Ur: NEGATIVE ng/mL
Cocaine (Metab.): NEGATIVE ng/mL
OPIATE QUANTITATIVE URINE: NEGATIVE ng/mL
PCP Quant, Ur: NEGATIVE ng/mL

## 2023-03-02 ENCOUNTER — Other Ambulatory Visit (HOSPITAL_BASED_OUTPATIENT_CLINIC_OR_DEPARTMENT_OTHER): Payer: Self-pay

## 2023-03-11 ENCOUNTER — Other Ambulatory Visit: Payer: Self-pay

## 2023-03-11 ENCOUNTER — Other Ambulatory Visit (HOSPITAL_BASED_OUTPATIENT_CLINIC_OR_DEPARTMENT_OTHER): Payer: Self-pay

## 2023-03-21 ENCOUNTER — Other Ambulatory Visit (HOSPITAL_BASED_OUTPATIENT_CLINIC_OR_DEPARTMENT_OTHER): Payer: Self-pay

## 2023-04-08 ENCOUNTER — Other Ambulatory Visit: Payer: Self-pay

## 2023-04-08 ENCOUNTER — Other Ambulatory Visit (HOSPITAL_BASED_OUTPATIENT_CLINIC_OR_DEPARTMENT_OTHER): Payer: Self-pay

## 2023-04-09 ENCOUNTER — Other Ambulatory Visit (HOSPITAL_BASED_OUTPATIENT_CLINIC_OR_DEPARTMENT_OTHER): Payer: Self-pay

## 2023-04-23 ENCOUNTER — Other Ambulatory Visit (HOSPITAL_BASED_OUTPATIENT_CLINIC_OR_DEPARTMENT_OTHER): Payer: Self-pay

## 2023-04-25 ENCOUNTER — Other Ambulatory Visit: Payer: Self-pay

## 2023-04-27 ENCOUNTER — Other Ambulatory Visit: Payer: Self-pay

## 2023-04-27 ENCOUNTER — Other Ambulatory Visit (HOSPITAL_BASED_OUTPATIENT_CLINIC_OR_DEPARTMENT_OTHER): Payer: Self-pay

## 2023-04-28 ENCOUNTER — Other Ambulatory Visit: Payer: Self-pay

## 2023-05-02 ENCOUNTER — Other Ambulatory Visit: Payer: Self-pay

## 2023-05-02 ENCOUNTER — Other Ambulatory Visit (HOSPITAL_BASED_OUTPATIENT_CLINIC_OR_DEPARTMENT_OTHER): Payer: Self-pay

## 2023-05-06 ENCOUNTER — Other Ambulatory Visit (HOSPITAL_BASED_OUTPATIENT_CLINIC_OR_DEPARTMENT_OTHER): Payer: Self-pay

## 2023-05-06 ENCOUNTER — Other Ambulatory Visit: Payer: Self-pay

## 2023-05-14 ENCOUNTER — Other Ambulatory Visit (HOSPITAL_BASED_OUTPATIENT_CLINIC_OR_DEPARTMENT_OTHER): Payer: Self-pay

## 2023-05-18 ENCOUNTER — Encounter: Payer: Self-pay | Admitting: Internal Medicine

## 2023-05-18 ENCOUNTER — Ambulatory Visit: Payer: Managed Care, Other (non HMO) | Admitting: Internal Medicine

## 2023-05-18 ENCOUNTER — Other Ambulatory Visit (HOSPITAL_BASED_OUTPATIENT_CLINIC_OR_DEPARTMENT_OTHER): Payer: Self-pay

## 2023-05-18 ENCOUNTER — Other Ambulatory Visit: Payer: Self-pay

## 2023-05-18 VITALS — BP 130/86 | HR 65 | Temp 97.2°F | Ht 68.5 in | Wt 220.0 lb

## 2023-05-18 DIAGNOSIS — Z0001 Encounter for general adult medical examination with abnormal findings: Secondary | ICD-10-CM | POA: Diagnosis not present

## 2023-05-18 DIAGNOSIS — E782 Mixed hyperlipidemia: Secondary | ICD-10-CM | POA: Diagnosis not present

## 2023-05-18 DIAGNOSIS — Z79899 Other long term (current) drug therapy: Secondary | ICD-10-CM

## 2023-05-18 DIAGNOSIS — E65 Localized adiposity: Secondary | ICD-10-CM

## 2023-05-18 DIAGNOSIS — Z23 Encounter for immunization: Secondary | ICD-10-CM | POA: Diagnosis not present

## 2023-05-18 DIAGNOSIS — G479 Sleep disorder, unspecified: Secondary | ICD-10-CM

## 2023-05-18 DIAGNOSIS — I152 Hypertension secondary to endocrine disorders: Secondary | ICD-10-CM | POA: Diagnosis not present

## 2023-05-18 DIAGNOSIS — F988 Other specified behavioral and emotional disorders with onset usually occurring in childhood and adolescence: Secondary | ICD-10-CM

## 2023-05-18 DIAGNOSIS — L989 Disorder of the skin and subcutaneous tissue, unspecified: Secondary | ICD-10-CM

## 2023-05-18 LAB — COMPREHENSIVE METABOLIC PANEL
ALT: 25 U/L (ref 0–53)
AST: 21 U/L (ref 0–37)
Albumin: 4.6 g/dL (ref 3.5–5.2)
Alkaline Phosphatase: 50 U/L (ref 39–117)
BUN: 23 mg/dL (ref 6–23)
CO2: 30 meq/L (ref 19–32)
Calcium: 9.2 mg/dL (ref 8.4–10.5)
Chloride: 98 meq/L (ref 96–112)
Creatinine, Ser: 1 mg/dL (ref 0.40–1.50)
GFR: 84.08 mL/min (ref >60.00)
Glucose, Bld: 103 mg/dL — ABNORMAL HIGH (ref 70–99)
Potassium: 4.5 meq/L (ref 3.5–5.1)
Sodium: 139 meq/L (ref 135–145)
Total Bilirubin: 1.2 mg/dL (ref 0.2–1.2)
Total Protein: 6.8 g/dL (ref 6.0–8.3)

## 2023-05-18 LAB — LIPID PANEL
Cholesterol: 142 mg/dL (ref 0–200)
HDL: 61.9 mg/dL (ref >39.00)
LDL Cholesterol: 57 mg/dL (ref 0–99)
NonHDL: 79.77
Total CHOL/HDL Ratio: 2
Triglycerides: 113 mg/dL (ref 0.0–149.0)
VLDL: 22.6 mg/dL (ref 0.0–40.0)

## 2023-05-18 LAB — CBC WITH DIFFERENTIAL/PLATELET
Basophils Absolute: 0 10*3/uL (ref 0.0–0.1)
Basophils Relative: 0.8 % (ref 0.0–3.0)
Eosinophils Absolute: 0.2 10*3/uL (ref 0.0–0.7)
Eosinophils Relative: 3.5 % (ref 0.0–5.0)
HCT: 47 % (ref 39.0–52.0)
Hemoglobin: 16.1 g/dL (ref 13.0–17.0)
Lymphocytes Relative: 22 % (ref 12.0–46.0)
Lymphs Abs: 1.1 10*3/uL (ref 0.7–4.0)
MCHC: 34.2 g/dL (ref 30.0–36.0)
MCV: 92.5 fL (ref 78.0–100.0)
Monocytes Absolute: 0.5 10*3/uL (ref 0.1–1.0)
Monocytes Relative: 10.6 % (ref 3.0–12.0)
Neutro Abs: 3.2 10*3/uL (ref 1.4–7.7)
Neutrophils Relative %: 63.1 % (ref 43.0–77.0)
Platelets: 185 10*3/uL (ref 150.0–400.0)
RBC: 5.08 Mil/uL (ref 4.22–5.81)
RDW: 12.6 % (ref 11.5–15.5)
WBC: 5 10*3/uL (ref 4.0–10.5)

## 2023-05-18 LAB — PSA: PSA: 1.02 ng/mL (ref 0.10–4.00)

## 2023-05-18 LAB — HEMOGLOBIN A1C: Hgb A1c MFr Bld: 5.3 % (ref 4.6–6.5)

## 2023-05-18 MED ORDER — AMPHETAMINE-DEXTROAMPHETAMINE 20 MG PO TABS
20.0000 mg | ORAL_TABLET | Freq: Every day | ORAL | 0 refills | Status: DC | PRN
  Filled 2023-05-18: qty 30, 30d supply, fill #0

## 2023-05-18 MED ORDER — AMPHETAMINE-DEXTROAMPHETAMINE 20 MG PO TABS
20.0000 mg | ORAL_TABLET | Freq: Every day | ORAL | 0 refills | Status: AC | PRN
  Filled 2023-07-28: qty 30, 30d supply, fill #0

## 2023-05-18 MED ORDER — AMPHETAMINE-DEXTROAMPHETAMINE 20 MG PO TABS
20.0000 mg | ORAL_TABLET | Freq: Every day | ORAL | 0 refills | Status: AC | PRN
  Filled 2023-06-18: qty 30, 30d supply, fill #0

## 2023-05-18 MED ORDER — TIRZEPATIDE-WEIGHT MANAGEMENT 2.5 MG/0.5ML ~~LOC~~ SOAJ
2.5000 mg | SUBCUTANEOUS | 11 refills | Status: DC
  Filled 2023-05-18 (×2): qty 2, 28d supply, fill #0

## 2023-05-18 MED ORDER — PREVNAR 20 0.5 ML IM SUSY
0.5000 mL | PREFILLED_SYRINGE | INTRAMUSCULAR | 0 refills | Status: AC
  Filled 2023-05-18: qty 0.5, 1d supply, fill #0

## 2023-05-18 NOTE — Assessment & Plan Note (Signed)
 Attention Deficit Disorder (ADD) Currently on Adderall 20 mg daily. Experiences frustration with ADD symptoms but prefers not to change medication due to side effects from other treatments. Discussed the impact of ADD on sleep and daily functioning. Refill Adderall 20 mg daily and schedule a follow-up visit in three months for prescription renewal.

## 2023-05-18 NOTE — Assessment & Plan Note (Signed)
 Hypertension Blood pressure today was 130/86 mmHg. On antihypertensive medication but not regularly monitoring blood pressure at home. Discussed the importance of weight loss and dietary changes. Considered weight loss medication (Zepbound ) for additional benefits, including improved sleep and potential impact on ADHD symptoms. Discussed the cost and insurance coverage of Zepbound  and Wegovy. Encourage regular blood pressure monitoring at home, discuss weight loss and dietary changes, and consider weight loss medication (Zepbound ).

## 2023-05-18 NOTE — Assessment & Plan Note (Signed)
 Try to get zepbound 

## 2023-05-18 NOTE — Patient Instructions (Addendum)
 Try SnoreLab App and sleep monitoring  VISIT SUMMARY:  Today, we discussed your sleep disturbances, ADHD management, and overall health. We reviewed your current medications and lifestyle habits, and made several recommendations to help improve your sleep and general well-being.  YOUR PLAN:  -SLEEP DISTURBANCE: Your sleep disturbances may be related to your ADHD and sleep habits. We recommend using the Snorelab app to monitor your sleep and provide data on your heart rate and oxygen levels. Additionally, we provided a handout on good sleep hygiene practices to help improve your sleep quality.  -ATTENTION DEFICIT DISORDER (ADD): ADD is a condition that affects your ability to focus and can impact your daily life. You will continue taking Adderall 20 mg daily, and we will renew your prescription in three months. We also discussed how ADD might be affecting your sleep and daily functioning.  -HYPERTENSION: Hypertension, or high blood pressure, can lead to serious health issues if not managed properly. Your blood pressure today was 130/86 mmHg. We discussed the importance of weight loss and dietary changes, and considered weight loss medication (Zepbound ) to help with this. Please monitor your blood pressure regularly at home.  -GENERAL HEALTH MAINTENANCE: Maintaining good health involves regular exercise, a balanced diet, and avoiding risky behaviors. You are up to date on most vaccinations but need a pneumonia shot. We recommend 150 minutes of exercise per week and a diet rich in fruits, vegetables, and healthy fats. A prescription for Prevnar 20  has been sent to the pharmacy.  INSTRUCTIONS:  Please schedule your next visit for Adderall and testosterone  prescription renewal. Complete the following lab tests: PSA, A1c, lipid panel, metabolic panel, blood count, and thyroid. Monitor and report any changes in skin spots. Discuss the pneumonia vaccination with your wife and get it at the pharmacy.

## 2023-05-18 NOTE — Assessment & Plan Note (Signed)
 PDMP reviewed during this encounter. Encouraged patient to submit random pill counts.

## 2023-05-18 NOTE — Assessment & Plan Note (Signed)
Check lipid

## 2023-05-18 NOTE — Progress Notes (Signed)
  [facility]: [phone]   -- Annual Preventive Medical Office Visit --  Patient:  Theodore Munoz      Age: 58 y.o.       Sex:  male  Date:   05/18/2023 Patient Care Team: Jesus Bernardino MATSU, MD as PCP - General (Internal Medicine) O'Neal, Darryle Debby, MD as PCP - Cardiology (Cardiology) Today's Healthcare Provider: Bernardino MATSU Jesus, MD  ========================================= Chief Complaint  Patient presents with   Annual Exam   Purpose of Visit: Comprehensive preventive health assessment and personalized health maintenance planning.  This encounter was conducted as a Comprehensive Physical Exam (CPE) preventive care annual visit. The patient's medical history and problem list were reviewed to inform individualized preventive care recommendations.  In addition, treatment of Attention Deficit Hyperactivity Disorder (ADHD), central adiposity, and hypertension related problem-specific medical treatment was provided during this visit.    Assessment & Plan Encounter for annual general medical examination with abnormal findings in adult Up to date on most vaccinations but needs a pneumonia shot. Discussed the importance of regular exercise, a healthy diet, and avoiding high-risk behaviors. Recommended 150 minutes of exercise per week and a diet rich in fruits, vegetables, and healthy fats. Recommend 150 minutes of exercise per week, advise a diet rich in fruits, vegetables, and healthy fats, avoid high-risk behaviors, and send a prescription for Prevnar 20  to the pharmacy.  Follow-up Schedule the next visit for Adderall and testosterone  prescription renewal. Complete lab tests: PSA, A1c, lipid panel, metabolic panel, blood count, thyroid. Monitor and report any changes in skin spots. Discuss pneumonia vaccination with wife and get it at the pharmacy. Sleep disturbances Sleep Disturbance Wakes up after approximately five hours of sleep with a 'haywire  brain'. No symptoms of sleep apnea. Possible contributing factors include ADD and poor sleep hygiene. Emphasized the importance of monitoring sleep quality and obtaining data on heart rate and oxygen levels during sleep. Improved sleep benefits overall health, cognitive function, and longevity. Recommend using the Snorelab app for sleep monitoring, provide a handout on sleep hygiene, and encourage updating with sleep data for further evaluation. Need for prophylactic vaccination with Streptococcus pneumoniae (Pneumococcus) and Influenza vaccines Patient will get at pharmacy. ADD (attention deficit disorder) without hyperactivity Attention Deficit Disorder (ADD) Currently on Adderall 20 mg daily. Experiences frustration with ADD symptoms but prefers not to change medication due to side effects from other treatments. Discussed the impact of ADD on sleep and daily functioning. Refill Adderall 20 mg daily and schedule a follow-up visit in three months for prescription renewal. High risk medication use PDMP reviewed during this encounter. Encouraged patient to submit random pill counts. Hypertension due to endocrine disorder Hypertension Blood pressure today was 130/86 mmHg. On antihypertensive medication but not regularly monitoring blood pressure at home. Discussed the importance of weight loss and dietary changes. Considered weight loss medication (Zepbound ) for additional benefits, including improved sleep and potential impact on ADHD symptoms. Discussed the cost and insurance coverage of Zepbound  and Wegovy. Encourage regular blood pressure monitoring at home, discuss weight loss and dietary changes, and consider weight loss medication (Zepbound ). Moderate mixed hyperlipidemia not requiring statin therapy Check lipid  Central adiposity Try to get zepbound  Skin lesion Photographed 05/18/23 for monitoring, appears benign. Diagnoses and all orders for this visit: Encounter for annual general medical  examination with abnormal findings in adult Sleep disturbances Need for prophylactic vaccination with Streptococcus pneumoniae (Pneumococcus) and Influenza vaccines   Today's Health Maintenance Counseling and Anticipatory  Guidance:  Eye exams:  every 1-2 years Dental cleanings: every 6 months or more, brush/floss 3x daily Sinus Care: saline spray rinses daily Sleep: 8 hr nightly, good sleep hygiene, If unrestful, use e-monitoring Diet:  fruits/vegetables/fiber/healthy fats, balance and moderation Exercise:  150 minutes/weekly Discouraged any/all high risk behaviors  Reviewed/updated/encouraged completion: Immunization History  Administered Date(s) Administered   Influenza, Seasonal, Injecte, Preservative Fre 01/08/2023   Influenza,inj,Quad PF,6+ Mos 02/13/2018, 01/04/2019   Pfizer Covid-19 Vaccine Bivalent Booster 25yrs & up 02/24/2021   Pfizer(Comirnaty )Fall Seasonal Vaccine 12 years and older 02/09/2022, 01/08/2023   Td 06/10/2005   Tdap 06/30/2017   Zoster Recombinant(Shingrix ) 01/04/2019, 05/01/2019   Health Maintenance Due  Topic Date Due   Pneumococcal Vaccine 51-48 Years old (1 of 2 - PCV) Never done   Hepatitis C Screening  Never done   COVID-19 Vaccine (4 - 2024-25 season) 03/05/2023   Health Maintenance  Topic Date Due   Pneumococcal Vaccine 62-62 Years old (1 of 2 - PCV) Never done   Hepatitis C Screening  Never done   COVID-19 Vaccine (4 - 2024-25 season) 03/05/2023   DTaP/Tdap/Td (3 - Td or Tdap) 07/01/2027   Colonoscopy  12/16/2027   INFLUENZA VACCINE  Completed   HIV Screening  Completed   Zoster Vaccines- Shingrix   Completed   HPV VACCINES  Aged Out   Social History   Substance and Sexual Activity  Sexual Activity Yes   Birth control/protection: I.U.D., Other-see comments   Comment: wife has IUD  .   Social History   Tobacco Use   Smoking status: Some Days    Types: Cigars   Smokeless tobacco: Never   Tobacco comments:    1-2 cigars per month  when out with the guys  Vaping Use   Vaping status: Never Used  Substance Use Topics   Alcohol  use: Yes    Alcohol /week: 4.0 standard drinks of alcohol     Types: 2 Cans of beer, 2 Shots of liquor per week    Comment: when playing golf with the guys   Drug use: Never      05/18/2023    8:12 AM  Depression screen PHQ 2/9  Decreased Interest 1  Down, Depressed, Hopeless 1  PHQ - 2 Score 2  Altered sleeping 3  Tired, decreased energy 1  Change in appetite 2  Feeling bad or failure about yourself  1  Trouble concentrating 3  Moving slowly or fidgety/restless 3  Suicidal thoughts 0  PHQ-9 Score 15  Difficult doing work/chores Extremely dIfficult  Doesn't want medication(s) or behavioral health referral at this time   Today's Cancer Screening Shared Decision Making Discussions: Penile/Testicle/Scrotum: encouraged self-monitoring and reporting of genital abnormalities. Patient reports there are none so genital exam deferred Thyroid:  checked and advised to check by palpating thyroid for nodules Prostate:  individualized risks/benefits/costs discussed  Lab Results  Component Value Date   PSA 0.75 05/01/2019   Colon:   UpToDate  Lung:  Current guidelines recommend Individuals aged 23 to 12 who currently smoke or formerly smoked and have a >= 20 pack-year smoking history should undergo annual screening with low-dose computed tomography (LDCT). Skin: Advised regular sunscreen use. He denies worrisome, changing, or new skin lesions. Offered to include images in chart for surveillance. Showed him pictures of melanomas for reference to educate for self-monitoring. Photographs Taken 05/18/2023 :    Other: discussed lack of screening guidelines and insurance coverage for other types.     Subjective  58 y.o. male  presents today for a complete physical exam.  History of Present Illness Theodore Munoz is a 58 year old male with ADHD who presents with sleep disturbances.  He experiences  sleep disturbances characterized by waking up after approximately five hours of sleep with a racing mind. This pattern persists for three to four days, followed by a night of good sleep, likely due to exhaustion. He attributes these disturbances to his ADHD, describing his mind as constantly active and unable to settle. No waking up gasping or experiencing apneas, although he acknowledges snoring, which he attributes to being overweight. He maintains a consistent bedtime routine but often falls asleep with the TV on, which his wife turns off after he falls asleep.  He has a history of ADHD and is currently taking Adderall 20 mg daily as needed, which helps him function at work. He experiences occasional anxiety and panic attacks, which he attributes to the frustration of living with ADHD. He has previously tried depression medications, which made him feel suicidal, and thus he discontinued them. He has not pursued therapy recently, as he feels his issues are primarily related to ADHD.  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 He has made dietary changes to reduce processed foods and increase whole grains, although he admits to not doing well in January and consuming more alcohol  than usual. He does not drink soda and prefers whole grain snacks and fruits, though he is not fond of vegetables.  He has a history of knee replacement surgery, which has resulted in some nerve damage, affecting his ability to kneel comfortably. No significant pain or discomfort from his wisdom teeth, which are impacted but not causing issues. He has a history of low vitamin D levels but is not currently supplementing or monitoring them. Chief Complaint  Patient presents with   Annual Exam   ROS Disclaimer about ROS at Annual Preventive Visits Patients are informed before the Review of Systems (ROS) that identifying significant medical issues during the wellness visit  may require immediate attention, potentially resulting in a separate billable encounter beyond the scope of the preventive exam. This disclosure is mandated by professional ethics and legal obligations, as healthcare providers must address any substantial health concerns raised during any patient interaction.  A comprehensive ROS is required by insurance companies for billing the visit. However, this structure may inadvertently discourage patients from fully disclosing health concerns due to potential financial implications. Consequently, patients often emphasize that any positive ROS findings are related to stable chronic conditions, requesting that these not be discussed during the preventive visit to avoid additional charges. Patients may also ask that reported complaints not be listed in the ROS to prevent affecting billing.  Problem list overviews that were updated at today's visit: No problems updated.  I attest that I have reviewed and confirmed the patients current medications to meet the medication reconciliation requirement  Current Outpatient Medications on File Prior to Visit  Medication Sig   acetaminophen  (TYLENOL ) 500 MG tablet Take 2 tablets (1,000 mg total) by mouth every 8 (eight) hours as needed for mild pain (pain score 1-3), fever or moderate pain (pain score 4-6).   amphetamine -dextroamphetamine  (ADDERALL) 20 MG tablet Take 1 tablet (20 mg total) by mouth daily as needed (work).   hydrochlorothiazide  (HYDRODIURIL ) 25 MG tablet Take 1 tablet (25 mg total) by mouth daily.   losartan  (COZAAR ) 50 MG tablet Take 1 tablet (50 mg total) by mouth  daily.   naproxen  sodium (ALEVE ) 220 MG tablet Take 1 tablet (220 mg total) by mouth daily as needed.   NEEDLE, DISP, 22 G (BD DISP NEEDLES) 22G X 1-1/2 MISC Inject 1 each into the muscle every 14 (fourteen) days.   omeprazole  (PRILOSEC) 20 MG capsule Take 1 capsule (20 mg total) by mouth daily.   rosuvastatin  (CRESTOR ) 10 MG tablet Take 1  tablet (10 mg total) by mouth daily.   SYRINGE-NEEDLE, DISP, 3 ML (B-D 3CC LUER-LOK SYR 18GX1-1/2) 18G X 1-1/2 3 ML MISC Inject 1 each into the muscle every 14 (fourteen) days.   testosterone  cypionate (DEPOTESTOSTERONE CYPIONATE) 200 MG/ML injection Inject 1 mL (200 mg total) into the muscle every 14 (fourteen) days.   No current facility-administered medications on file prior to visit.  There are no discontinued medications.The following were reviewed and/or entered/updated into our electronic MEDICAL RECORD NUMBERPast Medical History:  Diagnosis Date   Allergy    Anxiety    no meds   Arthritis    knees   Depression    no meds   Failed orthopedic implant (HCC) 04/08/2021   GERD (gastroesophageal reflux disease)    Hearing loss    no hearing aids   Hyperlipidemia    Hypertension    Plantar fasciitis 08/25/2015   Past Surgical History:  Procedure Laterality Date   bone infection in left leg      1984   CONVERSION TO TOTAL KNEE Right 04/08/2021   Procedure: CONVERSION TO TOTAL KNEE;  Surgeon: Fidel Rogue, MD;  Location: [facility];  Service: Orthopedics;  Laterality: Right;   JOINT REPLACEMENT Bilateral    knees   REPLACEMENT TOTAL KNEE BILATERAL     Social History   Socioeconomic History   Marital status: Married    Spouse name: Deanna   Number of children: Not on file   Years of education: Not on file   Highest education level: Not on file  Occupational History   Not on file  Tobacco Use   Smoking status: Some Days    Types: Cigars   Smokeless tobacco: Never   Tobacco comments:    1-2 cigars per month when out with the guys  Vaping Use   Vaping status: Never Used  Substance and Sexual Activity   Alcohol  use: Yes    Alcohol /week: 4.0 standard drinks of alcohol     Types: 2 Cans of beer, 2 Shots of liquor per week    Comment: when playing golf with the guys   Drug use: Never   Sexual activity: Yes    Birth control/protection: I.U.D., Other-see comments    Comment:  wife has IUD  Other Topics Concern   Not on file  Social History Narrative   Not on file   Social Drivers of Health   Financial Resource Strain: Not on file  Food Insecurity: Not on file  Transportation Needs: Not on file  Physical Activity: Not on file  Stress: Not on file  Social Connections: Not on file  Intimate Partner Violence: Not on file       No data to display         Family History  Problem Relation Age of Onset   Hyperlipidemia Mother    Hypertension Mother    Alcohol  abuse Mother    Arthritis Mother    Thyroid cancer Mother    Depression Mother    Anxiety disorder Mother    Heart attack Mother    Heart disease Mother  Colon cancer Neg Hx    Colon polyps Neg Hx    Rectal cancer Neg Hx    Stomach cancer Neg Hx    Allergies  Allergen Reactions   Lisinopril  Cough      Objective  BP 130/86   Pulse 65   Temp (!) 97.2 F (36.2 C) (Temporal)   Ht 5' 8.5 (1.74 m)   Wt 220 lb (99.8 kg)   SpO2 99%   BMI 32.96 kg/m Physical ExamBody mass index is 32.96 kg/m. BP Readings from Last 3 Encounters:  05/18/23 130/86  02/10/23 136/72  04/08/21 (!) 159/96   Wt Readings from Last 3 Encounters:  05/18/23 220 lb (99.8 kg)  02/10/23 223 lb 12.8 oz (101.5 kg)  03/27/21 184 lb (83.5 kg)   GEN: No acute distress, resting comfortably. HEENT: Tympanic membranes normal appearing bilaterally, oropharynx clear, no thyromegaly noted, no palpable lymphadenopathy or thyroid nodules.Dental caries in second upper molars and second lower molars on back left. No evidence of cancer in oral cavity. Slight hearing loss in left ear.  CARDIOVASCULAR: S1 and S2 heart sounds with regular rate and rhythm, no murmurs appreciated. PULMONARY: Normal work of breathing, clear to auscultation bilaterally, no crackles, wheezes, or rhonchi. ABDOMEN: Soft, nontender, nondistended. MSK: No edema, cyanosis, or clubbing noted. SKIN: Warm, dry, no lesions of concern observed. Photographs  Taken 05/18/2023 :    NEUROLOGICAL: Cranial nerves II-XII grossly intact, strength 5/5 in upper and lower extremities, Decreased reflex response in the right knee, likely related to previous knee replacement surgery. Visual acuity intact as patient able to read label on Purell hand sanitizer. PSYCH: Normal affect and thought content, pleasant and cooperative.  Results Reviewed: Last depression screening scores    05/18/2023    8:12 AM 02/10/2023    8:21 AM  PHQ 2/9 Scores  PHQ - 2 Score 2 4  PHQ- 9 Score 15 14   Last fall risk screening    05/18/2023    8:12 AM  Fall Risk   Falls in the past year? 0  Number falls in past yr: 0  Injury with Fall? 0  Risk for fall due to : No Fall Risks  Follow up Falls evaluation completed   Last CBC Lab Results  Component Value Date   WBC 7.9 03/27/2021   HGB 17.3 (H) 03/27/2021   HCT 51.0 03/27/2021   MCV 92.6 03/27/2021   MCH 31.4 03/27/2021   RDW 12.7 03/27/2021   PLT 179 03/27/2021   Last metabolic panel Lab Results  Component Value Date   GLUCOSE 118 (H) 03/27/2021   NA 136 03/27/2021   K 3.7 03/27/2021   CL 101 03/27/2021   CO2 27 03/27/2021   BUN 20 03/27/2021   CREATININE 0.89 03/27/2021   GFRNONAA >60 03/27/2021   CALCIUM  9.0 03/27/2021   PROT 7.4 03/27/2021   ALBUMIN 4.3 03/27/2021   BILITOT 1.1 03/27/2021   ALKPHOS 54 03/27/2021   AST 23 03/27/2021   ALT 33 03/27/2021   ANIONGAP 8 03/27/2021   Last lipids Lab Results  Component Value Date   CHOL 125 05/01/2019   HDL 49.80 05/01/2019   LDLCALC 60 05/01/2019   TRIG 80.0 05/01/2019   CHOLHDL 3 05/01/2019   Last hemoglobin A1c Lab Results  Component Value Date   HGBA1C 5.2 04/17/2019

## 2023-05-19 LAB — TSH RFX ON ABNORMAL TO FREE T4: TSH: 1.92 u[IU]/mL (ref 0.450–4.500)

## 2023-05-20 ENCOUNTER — Other Ambulatory Visit: Payer: Self-pay

## 2023-05-23 ENCOUNTER — Other Ambulatory Visit: Payer: Self-pay

## 2023-05-31 ENCOUNTER — Other Ambulatory Visit (HOSPITAL_BASED_OUTPATIENT_CLINIC_OR_DEPARTMENT_OTHER): Payer: Self-pay

## 2023-05-31 MED ORDER — CAPVAXIVE 0.5 ML IM SOSY
0.5000 mL | PREFILLED_SYRINGE | Freq: Once | INTRAMUSCULAR | 0 refills | Status: AC
  Filled 2023-05-31: qty 0.5, 1d supply, fill #0

## 2023-06-18 ENCOUNTER — Other Ambulatory Visit (HOSPITAL_BASED_OUTPATIENT_CLINIC_OR_DEPARTMENT_OTHER): Payer: Self-pay

## 2023-06-20 ENCOUNTER — Other Ambulatory Visit (HOSPITAL_BASED_OUTPATIENT_CLINIC_OR_DEPARTMENT_OTHER): Payer: Self-pay

## 2023-06-24 ENCOUNTER — Other Ambulatory Visit (HOSPITAL_BASED_OUTPATIENT_CLINIC_OR_DEPARTMENT_OTHER): Payer: Self-pay

## 2023-07-23 ENCOUNTER — Other Ambulatory Visit (HOSPITAL_BASED_OUTPATIENT_CLINIC_OR_DEPARTMENT_OTHER): Payer: Self-pay

## 2023-07-28 ENCOUNTER — Other Ambulatory Visit (HOSPITAL_BASED_OUTPATIENT_CLINIC_OR_DEPARTMENT_OTHER): Payer: Self-pay

## 2023-07-28 ENCOUNTER — Other Ambulatory Visit: Payer: Self-pay

## 2023-08-15 ENCOUNTER — Encounter: Payer: Self-pay | Admitting: Internal Medicine

## 2023-08-15 ENCOUNTER — Other Ambulatory Visit (HOSPITAL_BASED_OUTPATIENT_CLINIC_OR_DEPARTMENT_OTHER): Payer: Self-pay

## 2023-08-15 ENCOUNTER — Ambulatory Visit (INDEPENDENT_AMBULATORY_CARE_PROVIDER_SITE_OTHER): Payer: Managed Care, Other (non HMO) | Admitting: Internal Medicine

## 2023-08-15 ENCOUNTER — Other Ambulatory Visit: Payer: Self-pay

## 2023-08-15 VITALS — BP 138/82 | HR 72 | Temp 98.0°F | Ht 68.5 in | Wt 218.8 lb

## 2023-08-15 DIAGNOSIS — E785 Hyperlipidemia, unspecified: Secondary | ICD-10-CM

## 2023-08-15 DIAGNOSIS — Z79899 Other long term (current) drug therapy: Secondary | ICD-10-CM | POA: Diagnosis not present

## 2023-08-15 DIAGNOSIS — E291 Testicular hypofunction: Secondary | ICD-10-CM

## 2023-08-15 DIAGNOSIS — F988 Other specified behavioral and emotional disorders with onset usually occurring in childhood and adolescence: Secondary | ICD-10-CM | POA: Diagnosis not present

## 2023-08-15 DIAGNOSIS — I152 Hypertension secondary to endocrine disorders: Secondary | ICD-10-CM

## 2023-08-15 DIAGNOSIS — K2100 Gastro-esophageal reflux disease with esophagitis, without bleeding: Secondary | ICD-10-CM

## 2023-08-15 DIAGNOSIS — R29818 Other symptoms and signs involving the nervous system: Secondary | ICD-10-CM | POA: Diagnosis not present

## 2023-08-15 MED ORDER — LOSARTAN POTASSIUM 50 MG PO TABS
50.0000 mg | ORAL_TABLET | Freq: Every day | ORAL | 3 refills | Status: DC
  Filled 2023-08-15: qty 90, 90d supply, fill #0

## 2023-08-15 MED ORDER — BD DISP NEEDLES 22G X 1-1/2" MISC
1.0000 | 3 refills | Status: AC
  Filled 2023-08-15: qty 2, 28d supply, fill #0
  Filled 2023-11-07: qty 2, 28d supply, fill #1
  Filled 2023-12-14: qty 2, 28d supply, fill #2

## 2023-08-15 MED ORDER — BD LUER-LOK SYRINGE 18G X 1-1/2" 3 ML MISC
1.0000 | 1 refills | Status: AC
  Filled 2023-08-15: qty 2, 28d supply, fill #0
  Filled 2023-11-07: qty 2, 28d supply, fill #1
  Filled 2023-12-14: qty 2, 28d supply, fill #2

## 2023-08-15 MED ORDER — OMEPRAZOLE 20 MG PO CPDR
20.0000 mg | DELAYED_RELEASE_CAPSULE | Freq: Every day | ORAL | 1 refills | Status: DC
  Filled 2023-08-15: qty 90, 90d supply, fill #0

## 2023-08-15 MED ORDER — AMPHETAMINE-DEXTROAMPHETAMINE 20 MG PO TABS
20.0000 mg | ORAL_TABLET | Freq: Every day | ORAL | 0 refills | Status: AC | PRN
  Filled 2023-08-15 – 2023-09-03 (×2): qty 30, 30d supply, fill #0

## 2023-08-15 MED ORDER — TESTOSTERONE CYPIONATE 200 MG/ML IM SOLN
200.0000 mg | INTRAMUSCULAR | 1 refills | Status: DC
  Filled 2023-08-15: qty 10, 140d supply, fill #0

## 2023-08-15 MED ORDER — ROSUVASTATIN CALCIUM 10 MG PO TABS
10.0000 mg | ORAL_TABLET | Freq: Every day | ORAL | 3 refills | Status: AC
  Filled 2023-08-15: qty 90, 90d supply, fill #0
  Filled 2023-12-14: qty 90, 90d supply, fill #1

## 2023-08-15 MED ORDER — HYDROCHLOROTHIAZIDE 25 MG PO TABS
25.0000 mg | ORAL_TABLET | Freq: Every day | ORAL | 3 refills | Status: DC
  Filled 2023-08-15: qty 90, 90d supply, fill #0

## 2023-08-15 MED ORDER — AMPHETAMINE-DEXTROAMPHETAMINE 20 MG PO TABS: 20.0000 mg | ORAL_TABLET | Freq: Every day | ORAL | 0 refills | Status: DC | PRN

## 2023-08-15 NOTE — Progress Notes (Signed)
 ==============================  [facility]: [phone]   -- Medical Office Visit --  Patient: Theodore Munoz      Age: 58 y.o.       Sex:  male  Date:   08/15/2023 Today's Healthcare Provider: Anthon Kins, MD  ==============================   Chief Complaint: Medication Refill and Fatigue (Pt had two times last week breaking out in cold sweats little diarrhea and fatigue.)  History of Present Illness 58 year old male with ADHD and sleep apnea who presents for a medication refill and discussion of sleep apnea.  He is currently taking Adderall for ADHD, typically abstaining on Sundays, which he refers to as a 'crash day', resulting in tiredness and increased sleep. He is contemplating switching to Vyvanse for better appetite suppression and longer-lasting effects, and plans to consult with his wife, a pharmacist, about this change. He is concerned about the potential for the medication to lose effectiveness at higher doses.  He has a history of sleep apnea, which he believes contributes to his forgetfulness and ADHD symptoms. He is considering undergoing a sleep study to confirm the diagnosis and explore treatment options.  Recently, he experienced an episode of cold sweats and fatigue, which began on a Tuesday and improved by Friday. He describes the symptoms as breaking out in sweats and having no energy when attempting to do activities. He has since recovered and feels fine now.  He is preparing to move to [facility] in July and is planning to manage his medication refills accordingly. His wife will be traveling back to [facility], which will allow her to pick up his prescriptions until he establishes care with a new provider in [place].  He works in a [employer] for an [employer] conservation [employer]. No current side effects from his ADHD medication, including appetite suppression, unintentional weight loss, sleep problems, anxiety,  cardiac issues, or suicidal thoughts. No new medications or illicit substance use. Attention Deficit Hyperactivity Disorder (ADHD) questions: Appetite changes? No Unintentional weight loss? No  Is medication working well ? Yes Does patient take drug holidays? No Difficulties falling to sleep or maintaining sleep? No Any anxiety?  No Any cardiac issues (fainting or paliptations)? No Suicidal thoughts? No Changes in health since last visit? No New medications? No Any illicit substance abuse? No Has the patient taken his medication today? Yes Forgot to send in pill counts attributes to his add.   Background Reviewed: Problem List: has Osteoarthritis of right knee; Gastroesophageal reflux disease with esophagitis without hemorrhage; Arthritis; Panic disorder without agoraphobia; Allergies; Hyperlipidemia; Metabolic syndrome; Hypertension due to endocrine disorder; ADD (attention deficit disorder) without hyperactivity; High risk medication use; Hypogonadism in male; and Central adiposity on their problem list. Allergies:  is allergic to lisinopril .  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 Past Surgical History:   has a past surgical history that includes Joint replacement (Bilateral); Replacement total knee bilateral; bone infection in left leg ; and Conversion to total knee (Right, 04/08/2021). Social History:   reports that he has been smoking cigars. He has never used smokeless tobacco. He reports current alcohol  use of about 4.0 standard drinks of alcohol  per week. He reports that he does not use drugs. Family History:  family history includes Alcohol  abuse in his mother; Anxiety disorder in his mother; Arthritis in his mother;  Depression in his mother; Heart attack in his  mother; Heart disease in his mother; Hyperlipidemia in his mother; Hypertension in his mother; Thyroid cancer in his mother. Depression Screen and Health Maintenance:    05/18/2023    8:12 AM 02/10/2023    8:21 AM  PHQ 2/9 Scores  PHQ - 2 Score 2 4  PHQ- 9 Score 15 14    Medication Reconciliation: Current Outpatient Medications on File Prior to Visit  Medication Sig   acetaminophen  (TYLENOL ) 500 MG tablet Take 2 tablets (1,000 mg total) by mouth every 8 (eight) hours as needed for mild pain (pain score 1-3), fever or moderate pain (pain score 4-6).   amphetamine -dextroamphetamine  (ADDERALL) 20 MG tablet Take 1 tablet (20 mg total) by mouth daily as needed (work).   amphetamine -dextroamphetamine  (ADDERALL) 20 MG tablet Take 1 tablet (20 mg total) by mouth daily as needed (work).   naproxen  sodium (ALEVE ) 220 MG tablet Take 1 tablet (220 mg total) by mouth daily as needed.   No current facility-administered medications on file prior to visit.   Medications Discontinued During This Encounter  Medication Reason   amphetamine -dextroamphetamine  (ADDERALL) 20 MG tablet Reorder   tirzepatide  (ZEPBOUND ) 2.5 MG/0.5ML Pen Completed Course   hydrochlorothiazide  (HYDRODIURIL ) 25 MG tablet Reorder   losartan  (COZAAR ) 50 MG tablet Reorder   NEEDLE, DISP, 22 G (BD DISP NEEDLES) 22G X 1-1/2" MISC Reorder   omeprazole  (PRILOSEC) 20 MG capsule Reorder   rosuvastatin  (CRESTOR ) 10 MG tablet Reorder   SYRINGE-NEEDLE, DISP, 3 ML (B-D 3CC LUER-LOK SYR 18GX1-1/2) 18G X 1-1/2" 3 ML MISC Reorder   testosterone  cypionate (DEPOTESTOSTERONE CYPIONATE) 200 MG/ML injection Reorder     Physical Exam:    08/15/2023    8:03 AM 05/18/2023    8:08 AM 05/18/2023    8:02 AM  Vitals with BMI  Height 5' 8.5"  5' 8.5"  Weight 218 lbs 13 oz  220 lbs  BMI 32.78  32.96  Systolic 138 130 253  Diastolic 82 86 90  Pulse 72  65  Vital signs reviewed.  Nursing notes reviewed. Weight trend reviewed. General Appearance: Patient  is well-developed, well-nourished, and in no acute distress. Gait and posture appear normal. Pulmonary: Respirations are unlabored; no wheezing, rales, or other abnormal breath sounds noted on auscultation. Neurological: Patient is awake, alert, and oriented to person, place, and time. No focal neurological deficits detected on screening exam. Coordination, muscle strength, and sensation are intact. Psychiatric/Mental Status: Patient's mood is appropriate with a pleasant, calm demeanor. Speech is clear, coherent, and goal-directed. No observable signs of acute psychosis, mania, or significant anxiety. Substance Misuse Indicators: Pupils are equal, round, and reactive to light. No track marks, skin lesions, or other visible stigmata of substance misuse. Behavior is cooperative and consistent with stable ADHD management, with no signs suggestive of intoxication or withdrawal.  Physical Exam        No results found for any visits on 08/15/23. Office Visit on 05/18/2023  Component Date Value   Cholesterol 05/18/2023 142    Triglycerides 05/18/2023 113.0    HDL 05/18/2023 61.90    VLDL 05/18/2023 22.6    LDL Cholesterol 05/18/2023 57    Total CHOL/HDL Ratio 05/18/2023 2    NonHDL 05/18/2023 79.77    Sodium 05/18/2023 139    Potassium 05/18/2023 4.5    Chloride 05/18/2023 98    CO2 05/18/2023 30    Glucose, Bld 05/18/2023 103 (H)    BUN 05/18/2023 23    Creatinine,  Ser 05/18/2023 1.00    Total Bilirubin 05/18/2023 1.2    Alkaline Phosphatase 05/18/2023 50    AST 05/18/2023 21    ALT 05/18/2023 25    Total Protein 05/18/2023 6.8    Albumin 05/18/2023 4.6    GFR 05/18/2023 84.08    Calcium  05/18/2023 9.2    WBC 05/18/2023 5.0    RBC 05/18/2023 5.08    Hemoglobin 05/18/2023 16.1    HCT 05/18/2023 47.0    MCV 05/18/2023 92.5    MCHC 05/18/2023 34.2    RDW 05/18/2023 12.6    Platelets 05/18/2023 185.0    Neutrophils Relative % 05/18/2023 63.1    Lymphocytes Relative 05/18/2023  22.0    Monocytes Relative 05/18/2023 10.6    Eosinophils Relative 05/18/2023 3.5    Basophils Relative 05/18/2023 0.8    Neutro Abs 05/18/2023 3.2    Lymphs Abs 05/18/2023 1.1    Monocytes Absolute 05/18/2023 0.5    Eosinophils Absolute 05/18/2023 0.2    Basophils Absolute 05/18/2023 0.0    TSH 05/18/2023 1.920    PSA 05/18/2023 1.02    Hgb A1c MFr Bld 05/18/2023 5.3   Office Visit on 02/10/2023  Component Date Value   Amphetamines, Urine 02/10/2023 Positive (A)    Cannabinoid Quant, Ur 02/10/2023 Negative    Cocaine (Metab.) 02/10/2023 Negative    OPIATE QUANTITATIVE URINE 02/10/2023 Negative    PCP Quant, Ur 02/10/2023 Negative   No image results found. No results found.    Results     Assessment & Plan ADD (attention deficit disorder) without hyperactivity ADD is currently managed with Adderall 20 mg daily. He reports occasional forgetfulness and scatterbrained behavior, possibly linked to untreated sleep apnea. The current dose is low, and he is hesitant to increase it due to potential side effects. Discussed alternative medications for better appetite suppression and extended effects. Consider Vyvanse for its extended duration and appetite suppression benefits, though it may be on backorder. Continue Adderall 20 mg daily. Discuss with his wife about switching to Vyvanse. Consider Azstarys as an alternative if Vyvanse is unavailable. Schedule a follow-up appointment before moving to [facility] for a 90-day prescription fill. High risk medication use Adderall and testosterone   Hypogonadism in male Refilled testosterone  Suspected sleep apnea He is at high risk for sleep apnea due to factors such as a thick neck, male gender, snoring, and ADD. Untreated, it can lead to cardiovascular issues, cognitive decline, anxiety, and depression. Treatment can improve quality of life and may benefit ADD management. Initial resistance to CPAP therapy is common, but it often proves beneficial.  Obtain diagnostic data to confirm sleep apnea. Recommend purchasing an Apple Watch 9 or better for FDA-approved sleep apnea detection.   Refilled all medications for upcoming move      Orders Placed During this Encounter:   Meds ordered this encounter  Medications   amphetamine -dextroamphetamine  (ADDERALL) 20 MG tablet    Sig: Take 1 tablet (20 mg total) by mouth daily as needed for work.    Dispense:  30 tablet    Refill:  0   amphetamine -dextroamphetamine  (ADDERALL) 20 MG tablet    Sig: Take 1 tablet (20 mg total) by mouth daily as needed (work).    Dispense:  30 tablet    Refill:  0   rosuvastatin  (CRESTOR ) 10 MG tablet    Sig: Take 1 tablet (10 mg total) by mouth daily.    Dispense:  90 tablet    Refill:  3   hydrochlorothiazide  (HYDRODIURIL ) 25 MG  tablet    Sig: Take 1 tablet (25 mg total) by mouth daily.    Dispense:  90 tablet    Refill:  3   losartan  (COZAAR ) 50 MG tablet    Sig: Take 1 tablet (50 mg total) by mouth daily.    Dispense:  90 tablet    Refill:  3   testosterone  cypionate (DEPOTESTOSTERONE CYPIONATE) 200 MG/ML injection    Sig: Inject 1 mL (200 mg total) into the muscle every 14 (fourteen) days.    Dispense:  10 mL    Refill:  1   NEEDLE, DISP, 22 G (BD DISP NEEDLES) 22G X 1-1/2" MISC    Sig: Inject 1 each into the muscle every 14 (fourteen) days.    Dispense:  50 each    Refill:  3   SYRINGE-NEEDLE, DISP, 3 ML (B-D 3CC LUER-LOK SYR 18GX1-1/2) 18G X 1-1/2" 3 ML MISC    Sig: Inject 1 each into the muscle every 14 (fourteen) days.    Dispense:  50 each    Refill:  1   omeprazole  (PRILOSEC) 20 MG capsule    Sig: Take 1 capsule (20 mg total) by mouth daily.    Dispense:  90 capsule    Refill:  1        This document was synthesized by artificial intelligence (Abridge) using HIPAA-compliant recording of the clinical interaction;   We discussed the use of AI scribe software for clinical note transcription with the patient, who gave verbal consent to  proceed. additional Info: This encounter employed state-of-the-art, real-time, collaborative documentation. The patient actively reviewed and assisted in updating their electronic medical record on a shared screen, ensuring transparency and facilitating joint problem-solving for the problem list, overview, and plan. This approach promotes accurate, informed care. The treatment plan was discussed and reviewed in detail, including medication safety, potential side effects, and all patient questions. We confirmed understanding and comfort with the plan. Follow-up instructions were established, including contacting the office for any concerns, returning if symptoms worsen, persist, or new symptoms develop, and precautions for potential emergency department visits.

## 2023-08-15 NOTE — Assessment & Plan Note (Signed)
 Adderall and testosterone 

## 2023-08-15 NOTE — Assessment & Plan Note (Signed)
 ADD is currently managed with Adderall 20 mg daily. He reports occasional forgetfulness and scatterbrained behavior, possibly linked to untreated sleep apnea. The current dose is low, and he is hesitant to increase it due to potential side effects. Discussed alternative medications for better appetite suppression and extended effects. Consider Vyvanse for its extended duration and appetite suppression benefits, though it may be on backorder. Continue Adderall 20 mg daily. Discuss with his wife about switching to Vyvanse. Consider Azstarys as an alternative if Vyvanse is unavailable. Schedule a follow-up appointment before moving to [facility] for a 90-day prescription fill.

## 2023-08-15 NOTE — Assessment & Plan Note (Signed)
 Refilled testosterone

## 2023-08-15 NOTE — Patient Instructions (Addendum)
 Recommend apple watch 9 or better and soon. Also discuss Vyvanse or other longer acting medication(s) that will last longer and give more appetite suppression

## 2023-08-20 ENCOUNTER — Other Ambulatory Visit (HOSPITAL_BASED_OUTPATIENT_CLINIC_OR_DEPARTMENT_OTHER): Payer: Self-pay

## 2023-09-03 ENCOUNTER — Other Ambulatory Visit (HOSPITAL_BASED_OUTPATIENT_CLINIC_OR_DEPARTMENT_OTHER): Payer: Self-pay

## 2023-09-04 ENCOUNTER — Other Ambulatory Visit: Payer: Self-pay

## 2023-10-05 ENCOUNTER — Other Ambulatory Visit: Payer: Self-pay

## 2023-10-05 ENCOUNTER — Ambulatory Visit (INDEPENDENT_AMBULATORY_CARE_PROVIDER_SITE_OTHER): Admitting: Internal Medicine

## 2023-10-05 ENCOUNTER — Other Ambulatory Visit (HOSPITAL_BASED_OUTPATIENT_CLINIC_OR_DEPARTMENT_OTHER): Payer: Self-pay

## 2023-10-05 VITALS — BP 168/98 | HR 71 | Temp 97.3°F | Ht 68.5 in | Wt 219.8 lb

## 2023-10-05 DIAGNOSIS — K2100 Gastro-esophageal reflux disease with esophagitis, without bleeding: Secondary | ICD-10-CM | POA: Diagnosis not present

## 2023-10-05 DIAGNOSIS — I10 Essential (primary) hypertension: Secondary | ICD-10-CM

## 2023-10-05 DIAGNOSIS — N401 Enlarged prostate with lower urinary tract symptoms: Secondary | ICD-10-CM

## 2023-10-05 DIAGNOSIS — G4733 Obstructive sleep apnea (adult) (pediatric): Secondary | ICD-10-CM

## 2023-10-05 DIAGNOSIS — E291 Testicular hypofunction: Secondary | ICD-10-CM

## 2023-10-05 DIAGNOSIS — R399 Unspecified symptoms and signs involving the genitourinary system: Secondary | ICD-10-CM

## 2023-10-05 DIAGNOSIS — I152 Hypertension secondary to endocrine disorders: Secondary | ICD-10-CM | POA: Diagnosis not present

## 2023-10-05 DIAGNOSIS — F988 Other specified behavioral and emotional disorders with onset usually occurring in childhood and adolescence: Secondary | ICD-10-CM | POA: Diagnosis not present

## 2023-10-05 MED ORDER — AMPHETAMINE-DEXTROAMPHETAMINE 20 MG PO TABS
20.0000 mg | ORAL_TABLET | Freq: Every day | ORAL | 0 refills | Status: AC | PRN
  Filled 2023-10-14 – 2023-11-07 (×2): qty 30, 30d supply, fill #0

## 2023-10-05 MED ORDER — AMPHETAMINE-DEXTROAMPHETAMINE 20 MG PO TABS: 20.0000 mg | ORAL_TABLET | Freq: Every day | ORAL | 0 refills | Status: AC | PRN

## 2023-10-05 MED ORDER — TESTOSTERONE CYPIONATE 200 MG/ML IM SOLN
100.0000 mg | INTRAMUSCULAR | 1 refills | Status: AC
  Filled 2023-10-05: qty 10, 140d supply, fill #0
  Filled 2023-11-07: qty 6, 42d supply, fill #0
  Filled 2023-12-14: qty 6, 42d supply, fill #1

## 2023-10-05 MED ORDER — TAMSULOSIN HCL 0.4 MG PO CAPS
0.4000 mg | ORAL_CAPSULE | Freq: Every day | ORAL | 3 refills | Status: AC
  Filled 2023-10-05: qty 90, 90d supply, fill #0
  Filled 2023-12-14 – 2023-12-19 (×2): qty 90, 90d supply, fill #1

## 2023-10-05 MED ORDER — NAPROXEN SODIUM 220 MG PO TABS
220.0000 mg | ORAL_TABLET | Freq: Every day | ORAL | 3 refills | Status: AC | PRN
  Filled 2023-10-05: qty 50, 50d supply, fill #0
  Filled 2023-11-07: qty 50, 50d supply, fill #1
  Filled 2023-12-14: qty 50, 50d supply, fill #2

## 2023-10-05 MED ORDER — OMEPRAZOLE 20 MG PO CPDR
20.0000 mg | DELAYED_RELEASE_CAPSULE | Freq: Every day | ORAL | 3 refills | Status: AC
  Filled 2023-10-05 – 2023-12-14 (×2): qty 90, 90d supply, fill #0

## 2023-10-05 MED ORDER — AMPHETAMINE-DEXTROAMPHETAMINE 20 MG PO TABS
20.0000 mg | ORAL_TABLET | Freq: Every day | ORAL | 0 refills | Status: AC | PRN
  Filled 2023-12-14: qty 30, 30d supply, fill #0

## 2023-10-05 MED ORDER — LOSARTAN POTASSIUM-HCTZ 100-25 MG PO TABS
1.0000 | ORAL_TABLET | Freq: Every day | ORAL | 3 refills | Status: AC
  Filled 2023-10-05: qty 90, 90d supply, fill #0
  Filled 2023-12-14: qty 90, 90d supply, fill #1

## 2023-10-05 NOTE — Assessment & Plan Note (Signed)
 ADHD is managed with Adderall, last filled on May 5th. [place] complic[place]ills due to controlled substance regulations. There is concern about stroke risk with Adderall, especially with hypertension. Set up a 90-day supply of Adderall for his wife's monthly pickup. Discuss potential video visit in 90 days, contingent on telehealth regulatory changes. Advise tight blood pressure management to mitigate stroke risk with Adderall.

## 2023-10-05 NOTE — Progress Notes (Signed)
 ==============================  [facility]: [phone]   -- Medical Office Visit --  Patient: Theodore Munoz      Age: 58 y.o.       Sex:  male  Date:   10/05/2023 Today's Healthcare Provider: Bernardino KANDICE Cone, MD  ==============================   Chief Complaint: 8 week f/u Medication(s) management- plan to move away out of state  Discussed the use of AI scribe software for clinical note transcription with the patient, who gave verbal consent to proceed.  History of Present Illness Theodore Munoz is a 57 year old male with hypertension who presents for a farewell visit and medication management.  He is experiencing issues with hypertension, with recent blood pressure readings in the 130s/80s and sometimes higher. He is currently taking hydrochlorothiazide  and losartan  50/25 mg. He attributes some of the elevated readings to stress related to moving and selling his house.  He is managing ADHD with Adderall, which was last filled on May 5th. He has about a month left of his current supply and plans to have a 90-day supply filled at a local pharmacy, as his wife will be returning monthly to pick it up. He is aware of the potential risks of combining Adderall with high blood pressure, especially as he ages.  He uses naproxen  and Tylenol  for knee pain, using naproxen  when pain is more than 8 out of 10. He is advised to limit naproxen  use due to its potential to raise blood pressure.  He is on testosterone  therapy, taking 200 mg every 14 days. He mentions sleep issues, possibly related to sleep apnea, and experiences symptoms such as snoring, morning headaches, and poor sleep quality. He is considering using a CPAP machine.  He has noticed some urinary symptoms, such as a weaker stream and increased frequency, which he attributes to possible prostate issues. He does not experience a sensation of incomplete bladder emptying.  He is currently taking  rosuvastatin  and Prilosec, with a sufficient supply of both medications.  He works in a [employer] for an [employer] conservation [employer]. He is in the process of moving and selling his house, which is causing stress. He previously worked in a job where he walked about eight miles a day and hopes to find similar work after moving. No recent exercise.  BP Readings from Last 30 Encounters:  10/05/23 (!) 168/98  08/15/23 138/82  05/18/23 130/86  02/10/23 136/72  04/08/21 (!) 159/96  03/27/21 (!) 162/98  05/21/19 (!) 178/79  05/01/19 128/84  04/24/19 114/71  04/17/19 (!) 148/84  04/16/19 (!) 176/96  04/15/19 (!) 165/85  12/15/17 137/79  06/30/17 132/84  08/02/15 (!) 137/94     Lab Results  Component Value Date   PSA 1.02 05/18/2023   PSA 0.75 05/01/2019    Background Reviewed: Problem List: has Osteoarthritis of right knee; Gastroesophageal reflux disease with esophagitis without hemorrhage; Arthritis; Panic disorder without agoraphobia; Allergies; Hyperlipidemia; Metabolic syndrome; Hypertension due to endocrine disorder; ADD (attention deficit disorder) without hyperactivity; High risk medication use; Hypogonadism in male; and Central adiposity on their problem list. 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 Past Surgical History:   has a past surgical history that includes Joint replacement (Bilateral); Replacement total knee bilateral; bone infection in left leg ; and Conversion to total knee (Right, 04/08/2021). Social History:   reports  that he has been smoking cigars. He has never used smokeless tobacco. He reports current alcohol  use of about 4.0 standard drinks of alcohol  per week. He reports that he does not use drugs. Family History:  family history includes  Alcohol  abuse in his mother; Anxiety disorder in his mother; Arthritis in his mother; Depression in his mother; Heart attack in his mother; Heart disease in his mother; Hyperlipidemia in his mother; Hypertension in his mother; Thyroid cancer in his mother. Allergies:  is allergic to lisinopril .   Medication Reconciliation: Current Outpatient Medications on File Prior to Visit  Medication Sig   acetaminophen  (TYLENOL ) 500 MG tablet Take 2 tablets (1,000 mg total) by mouth every 8 (eight) hours as needed for mild pain (pain score 1-3), fever or moderate pain (pain score 4-6).   amphetamine -dextroamphetamine  (ADDERALL) 20 MG tablet Take 1 tablet (20 mg total) by mouth daily as needed (work).   amphetamine -dextroamphetamine  (ADDERALL) 20 MG tablet Take 1 tablet (20 mg total) by mouth daily as needed (work).   amphetamine -dextroamphetamine  (ADDERALL) 20 MG tablet Take 1 tablet (20 mg total) by mouth daily as needed for work.   NEEDLE, DISP, 22 G (BD DISP NEEDLES) 22G X 1-1/2 MISC Inject 1 each into the muscle every 14 (fourteen) days.   rosuvastatin  (CRESTOR ) 10 MG tablet Take 1 tablet (10 mg total) by mouth daily.   SYRINGE-NEEDLE, DISP, 3 ML (B-D 3CC LUER-LOK SYR 18GX1-1/2) 18G X 1-1/2 3 ML MISC Inject 1 each into the muscle every 14 (fourteen) days.   No current facility-administered medications on file prior to visit.   Medications Discontinued During This Encounter  Medication Reason   amphetamine -dextroamphetamine  (ADDERALL) 20 MG tablet Reorder   hydrochlorothiazide  (HYDRODIURIL ) 25 MG tablet Completed Course   losartan  (COZAAR ) 50 MG tablet Completed Course   naproxen  sodium (ALEVE ) 220 MG tablet Reorder   testosterone  cypionate (DEPOTESTOSTERONE CYPIONATE) 200 MG/ML injection Reorder   omeprazole  (PRILOSEC) 20 MG capsule Reorder     Physical Exam:    10/05/2023    8:04 AM 08/15/2023    8:03 AM 05/18/2023    8:08 AM  Vitals with BMI  Height 5' 8.5 5' 8.5   Weight 219 lbs 13 oz 218  lbs 13 oz   BMI 32.93 32.78   Systolic 168 138 869  Diastolic 98 82 86  Pulse 71 72   Vital signs reviewed.  Nursing notes reviewed. Weight trend reviewed. Physical Exam General Appearance:  No acute distress appreciable.   Well-groomed, healthy-appearing male.  Well proportioned with no abnormal fat distribution.  Good muscle tone. Pulmonary:  Normal work of breathing at rest, no respiratory distress apparent. SpO2: 98 %  Musculoskeletal: All extremities are intact.  Neurological:  Awake, alert, oriented, and engaged.  No obvious focal neurological deficits or cognitive impairments.  Sensorium seems unclouded.   Speech is clear and coherent with logical content.  Results:    05/18/2023    8:12 AM 02/10/2023    8:21 AM  PHQ 2/9 Scores  PHQ - 2 Score 2 4  PHQ- 9 Score 15 14   Results LABS PSA: within normal limits    No results found for any visits on 10/05/23. Office Visit on 05/18/2023  Component Date Value Ref Range Status   Cholesterol 05/18/2023 142  0 - 200 mg/dL Final   Triglycerides 97/94/7974 113.0  0.0 - 149.0 mg/dL Final   HDL 97/94/7974 61.90  >39.00 mg/dL Final   VLDL 97/94/7974 22.6  0.0 - 40.0 mg/dL Final  LDL Cholesterol 05/18/2023 57  0 - 99 mg/dL Final   Total CHOL/HDL Ratio 05/18/2023 2   Final   NonHDL 05/18/2023 79.77   Final   Sodium 05/18/2023 139  135 - 145 mEq/L Final   Potassium 05/18/2023 4.5  3.5 - 5.1 mEq/L Final   Chloride 05/18/2023 98  96 - 112 mEq/L Final   CO2 05/18/2023 30  19 - 32 mEq/L Final   Glucose, Bld 05/18/2023 103 (H)  70 - 99 mg/dL Final   BUN 97/94/7974 23  6 - 23 mg/dL Final   Creatinine, Ser 05/18/2023 1.00  0.40 - 1.50 mg/dL Final   Total Bilirubin 05/18/2023 1.2  0.2 - 1.2 mg/dL Final   Alkaline Phosphatase 05/18/2023 50  39 - 117 U/L Final   AST 05/18/2023 21  0 - 37 U/L Final   ALT 05/18/2023 25  0 - 53 U/L Final   Total Protein 05/18/2023 6.8  6.0 - 8.3 g/dL Final   Albumin 97/94/7974 4.6  3.5 - 5.2 g/dL Final   GFR  97/94/7974 84.08  >60.00 mL/min Final   Calcium  05/18/2023 9.2  8.4 - 10.5 mg/dL Final   WBC 97/94/7974 5.0  4.0 - 10.5 K/uL Final   RBC 05/18/2023 5.08  4.22 - 5.81 Mil/uL Final   Hemoglobin 05/18/2023 16.1  13.0 - 17.0 g/dL Final   HCT 97/94/7974 47.0  39.0 - 52.0 % Final   MCV 05/18/2023 92.5  78.0 - 100.0 fl Final   MCHC 05/18/2023 34.2  30.0 - 36.0 g/dL Final   RDW 97/94/7974 12.6  11.5 - 15.5 % Final   Platelets 05/18/2023 185.0  150.0 - 400.0 K/uL Final   Neutrophils Relative % 05/18/2023 63.1  43.0 - 77.0 % Final   Lymphocytes Relative 05/18/2023 22.0  12.0 - 46.0 % Final   Monocytes Relative 05/18/2023 10.6  3.0 - 12.0 % Final   Eosinophils Relative 05/18/2023 3.5  0.0 - 5.0 % Final   Basophils Relative 05/18/2023 0.8  0.0 - 3.0 % Final   Neutro Abs 05/18/2023 3.2  1.4 - 7.7 K/uL Final   Lymphs Abs 05/18/2023 1.1  0.7 - 4.0 K/uL Final   Monocytes Absolute 05/18/2023 0.5  0.1 - 1.0 K/uL Final   Eosinophils Absolute 05/18/2023 0.2  0.0 - 0.7 K/uL Final   Basophils Absolute 05/18/2023 0.0  0.0 - 0.1 K/uL Final   TSH 05/18/2023 1.920  0.450 - 4.500 uIU/mL Final   PSA 05/18/2023 1.02  0.10 - 4.00 ng/mL Final   Hgb A1c MFr Bld 05/18/2023 5.3  4.6 - 6.5 % Final  Office Visit on 02/10/2023  Component Date Value Ref Range Status   Amphetamines, Urine 02/10/2023 Positive (A)  Cutoff=1000 Final   Cannabinoid Quant, Ur 02/10/2023 Negative  Cutoff=50 ng/mL Final   Cocaine (Metab.) 02/10/2023 Negative  Cutoff=300 ng/mL Final   OPIATE QUANTITATIVE URINE 02/10/2023 Negative  Cutoff=2000 ng/mL Final   PCP Quant, Ur 02/10/2023 Negative  Cutoff=25 ng/mL Final  No image results found. No results found.       Assessment & Plan Primary hypertension  ADD (attention deficit disorder) without hyperactivity ADHD is managed with Adderall, last filled on May 5th. Moving out of state complicates refills due to controlled substance regulations. There is concern about stroke risk with Adderall,  especially with hypertension. Set up a 90-day supply of Adderall for his wife's monthly pickup. Discuss potential video visit in 90 days, contingent on telehealth regulatory changes. Advise tight blood pressure management to mitigate stroke risk  with Adderall. Gastroesophageal reflux disease with esophagitis without hemorrhage  Hypertension due to endocrine disorder Blood pressure readings remain suboptimal, often in the 130s/80s range. Current treatment with hydrochlorothiazide  and losartan  50/25 mg may be insufficient, especially with stress from moving. Increase losartan  and hydrochlorothiazide  to a 100/25 mg combo pill. [facility] to [facility] and advise close monitoring of blood pressure with the new regimen. Hypogonadism in male Testosterone  injections are administered at 200 mg every 14 days, which may cause mood swings and affect blood pressure due to testosterone  spikes. Weekly dosing of 100 mg is suggested to reduce spikes and stabilize mood and blood pressure, aligning with exercise routines for muscle growth benefits. Change testosterone  dosing to 100 mg weekly and coordinate injections with workout schedule to maximize muscle growth. OSA (obstructive sleep apnea) He reports variable sleep quality with symptoms suggestive of sleep apnea, including snoring, morning headaches, and poor sleep quality as indicated by an [employer]. A CPAP machine is recommended to improve sleep quality and reduce symptoms. Provide order for a CPAP machine and advise working with insurance for coverage. Submit watch readings to insurance or prior authorization team for CPAP approval. Lower urinary tract symptoms (LUTS) He reports a weaker urine stream and increased frequency, indicating early prostate issues. Testosterone  therapy can contribute to prostate growth. Flomax is prescribed to improve urine stream and reduce nocturia. Recommend follow-up with a urologist after moving. Benign  localized prostatic hyperplasia with lower urinary tract symptoms (LUTS) He reports a weaker urine stream and increased frequency, indicating early prostate issues. Testosterone  therapy can contribute to prostate growth. Flomax is prescribed to improve urine stream and reduce nocturia. Recommend follow-up with a urologist after moving.   General Health Maintenance   He is considering lifestyle changes to improve health, including weight loss and exercise, with an emphasis on managing weight and blood pressure. Naproxen  use should be limited due to its potential to raise blood pressure. Encourage weight loss and regular exercise to improve blood pressure and health. Advise finding a new primary care doctor and urologist after moving. Limit naproxen  use to when pain is more than 8/10 to avoid raising blood pressure.  Follow-up   He is moving out of state and needs to establish care with new healthcare providers. Potential regulatory change affecting telehealth visits may impact future care. Schedule a video visit for September 25th, before potential telehealth regulation changes. Advise scheduling appointments with new primary care and urologist after moving.  Recording duration: 21 minutes       Orders Placed in Encounter:   Lab Orders  No laboratory test(s) ordered today   Imaging Orders  No imaging studies ordered today   Referral Orders  No referral(s) requested today   Meds ordered this encounter  Medications   losartan -hydrochlorothiazide  (HYZAAR) 100-25 MG tablet    Sig: Take 1 tablet by mouth daily.    Dispense:  90 tablet    Refill:  3    Dose change and replaces losartan  50 and hydrochloroTHIAZIDE  25   amphetamine -dextroamphetamine  (ADDERALL) 20 MG tablet    Sig: Take 1 tablet (20 mg total) by mouth daily as needed (work).    Dispense:  30 tablet    Refill:  0    Fill when due   amphetamine -dextroamphetamine  (ADDERALL) 20 MG tablet    Sig: Take 1 tablet (20 mg total) by  mouth daily as needed (work).    Dispense:  30 tablet    Refill:  0  Fill when due   amphetamine -dextroamphetamine  (ADDERALL) 20 MG tablet    Sig: Take 1 tablet (20 mg total) by mouth daily as needed (work).    Dispense:  30 tablet    Refill:  0    Fill when due   omeprazole  (PRILOSEC) 20 MG capsule    Sig: Take 1 capsule (20 mg total) by mouth daily.    Dispense:  90 capsule    Refill:  3   naproxen  sodium (ALEVE ) 220 MG tablet    Sig: Take 1 tablet (220 mg total) by mouth daily as needed.    Dispense:  50 tablet    Refill:  3   testosterone  cypionate (DEPOTESTOSTERONE CYPIONATE) 200 MG/ML injection    Sig: Inject 0.5 mLs (100 mg total) into the muscle every 7 (seven) days.    Dispense:  10 mL    Refill:  1    Provide additional needles as needed fill when due.   tamsulosin (FLOMAX) 0.4 MG CAPS capsule    Sig: Take 1 capsule (0.4 mg total) by mouth daily.    Dispense:  90 capsule    Refill:  3      Durable Medical Equipment  (From admission, onward)           Start     Ordered   10/05/23 0000  For home use only DME continuous positive airway pressure (CPAP)       Question Answer Comment  Length of Need Lifetime   Patient has OSA or probable OSA Yes   Is the patient currently using CPAP in the home Yes   If yes (to question two) Determine DME provider and inform them of any new orders/settings   If no (to question two) date of sleep study apple watch showing osa   Settings Autotitration   Signs and symptoms of probable OSA  (select all that apply) Snoring   Signs and symptoms of probable OSA  (select all that apply) Moring headaches   Signs and symptoms of probable OSA  (select all that apply) Witnessed apneas   Signs and symptoms of probable OSA  (select all that apply) Choking   Signs and symptoms of probable OSA  (select all that apply) Gasping during sleep   CPAP supplies needed Mask, headgear, cushions, filters, heated tubing and water  chamber   Additional  equipment included Heated humification and supplies   Additional equipment included None      10/05/23 0832             This document was synthesized by artificial intelligence ([employer]) using HIPAA-compliant recording of the clinical interaction;   We discussed the use of AI scribe software for clinical note transcription with the patient, who gave verbal consent to proceed. additional Info: This encounter employed state-of-the-art, real-time, collaborative documentation. The patient actively reviewed and assisted in updating their electronic medical record on a shared screen, ensuring transparency and facilitating joint problem-solving for the problem list, overview, and plan. This approach promotes accurate, informed care. The treatment plan was discussed and reviewed in detail, including medication safety, potential side effects, and all patient questions. We confirmed understanding and comfort with the plan. Follow-up instructions were established, including contacting the office for any concerns, returning if symptoms worsen, persist, or new symptoms develop, and precautions for potential emergency department visits.

## 2023-10-05 NOTE — Assessment & Plan Note (Signed)
 Blood pressure readings remain suboptimal, often in the 130s/80s range. Current treatment with hydrochlorothiazide  and losartan  50/25 mg may be insufficient, especially with stress from moving. Increase losartan  and hydrochlorothiazide  to a 100/25 mg combo pill. Send prescription to [facility] and advise close monitoring of blood pressure with the new regimen.

## 2023-10-05 NOTE — Assessment & Plan Note (Signed)
 Testosterone  injections are administered at 200 mg every 14 days, which may cause mood swings and affect blood pressure due to testosterone  spikes. Weekly dosing of 100 mg is suggested to reduce spikes and stabilize mood and blood pressure, aligning with exercise routines for muscle growth benefits. Change testosterone  dosing to 100 mg weekly and coordinate injections with workout schedule to maximize muscle growth.

## 2023-10-05 NOTE — Patient Instructions (Signed)
 VISIT SUMMARY:  During your visit, we discussed your hypertension, ADHD, hypogonadism, prostate issues, and suspected sleep apnea. We also reviewed your current medications and made some adjustments to better manage your conditions. Additionally, we talked about general health maintenance and the importance of finding new healthcare providers after your move.  YOUR PLAN:  -HYPERTENSION: Hypertension, or high blood pressure, is when the force of your blood against your artery walls is too high. We increased your losartan  and hydrochlorothiazide  to a 100/25 mg combo pill to better control your blood pressure. Please monitor your blood pressure closely with this new regimen.  -ATTENTION DEFICIT HYPERACTIVITY DISORDER (ADHD): ADHD is a condition that affects your ability to focus and control impulses. We arranged for a 90-day supply of Adderall, which your wife will pick up monthly. It's important to manage your blood pressure carefully to reduce the risk of stroke while taking Adderall.  -HYPOGONADISM: Hypogonadism is a condition where your body doesn't produce enough testosterone . We changed your testosterone  injections to 100 mg weekly to help stabilize your mood and blood pressure. Coordinate your injections with your workout schedule for the best results.  -PROSTATE ISSUES: You have reported symptoms that may indicate early prostate issues, such as a weaker urine stream and increased frequency. We prescribed Flomax to help improve your urine flow and reduce nighttime urination. Follow up with a urologist after your move.  -SUSPECTED SLEEP APNEA: Sleep apnea is a condition where your breathing repeatedly stops and starts during sleep. We recommend using a CPAP machine to improve your sleep quality and reduce symptoms. Please work with your insurance for coverage and submit your watch readings for approval.  -GENERAL HEALTH MAINTENANCE: We discussed the importance of weight loss and regular exercise  to improve your overall health and blood pressure. Limit your use of naproxen  to when your pain is more than 8 out of 10 to avoid raising your blood pressure. Make sure to find a new primary care doctor and urologist after your move.  INSTRUCTIONS:  Please schedule a video visit for September 25th before potential telehealth regulation changes. Additionally, make appointments with a new primary care doctor and urologist after you move.

## 2023-10-06 ENCOUNTER — Other Ambulatory Visit (HOSPITAL_BASED_OUTPATIENT_CLINIC_OR_DEPARTMENT_OTHER): Payer: Self-pay

## 2023-10-08 ENCOUNTER — Other Ambulatory Visit (HOSPITAL_BASED_OUTPATIENT_CLINIC_OR_DEPARTMENT_OTHER): Payer: Self-pay

## 2023-10-11 ENCOUNTER — Ambulatory Visit: Admitting: Internal Medicine

## 2023-10-15 ENCOUNTER — Other Ambulatory Visit (HOSPITAL_BASED_OUTPATIENT_CLINIC_OR_DEPARTMENT_OTHER): Payer: Self-pay

## 2023-10-26 ENCOUNTER — Other Ambulatory Visit (HOSPITAL_BASED_OUTPATIENT_CLINIC_OR_DEPARTMENT_OTHER): Payer: Self-pay

## 2023-11-07 ENCOUNTER — Other Ambulatory Visit: Payer: Self-pay

## 2023-11-07 ENCOUNTER — Other Ambulatory Visit (HOSPITAL_BASED_OUTPATIENT_CLINIC_OR_DEPARTMENT_OTHER): Payer: Self-pay

## 2023-11-08 ENCOUNTER — Other Ambulatory Visit (HOSPITAL_BASED_OUTPATIENT_CLINIC_OR_DEPARTMENT_OTHER): Payer: Self-pay

## 2023-11-08 ENCOUNTER — Other Ambulatory Visit: Payer: Self-pay

## 2023-12-14 ENCOUNTER — Other Ambulatory Visit (HOSPITAL_BASED_OUTPATIENT_CLINIC_OR_DEPARTMENT_OTHER): Payer: Self-pay

## 2023-12-14 ENCOUNTER — Other Ambulatory Visit: Payer: Self-pay

## 2023-12-19 ENCOUNTER — Other Ambulatory Visit (HOSPITAL_BASED_OUTPATIENT_CLINIC_OR_DEPARTMENT_OTHER): Payer: Self-pay

## 2024-01-05 ENCOUNTER — Ambulatory Visit: Admitting: Internal Medicine

## 2024-01-09 ENCOUNTER — Other Ambulatory Visit (HOSPITAL_BASED_OUTPATIENT_CLINIC_OR_DEPARTMENT_OTHER): Payer: Self-pay
